# Patient Record
Sex: Male | Born: 1950 | Race: White | Hispanic: No | Marital: Married | Smoking: Former smoker
Health system: Southern US, Community
[De-identification: ages and names within clinical notes are randomized; demographics above are authoritative.]

## PROBLEM LIST (undated history)

## (undated) DIAGNOSIS — K219 Gastro-esophageal reflux disease without esophagitis: Secondary | ICD-10-CM

## (undated) DIAGNOSIS — N521 Erectile dysfunction due to diseases classified elsewhere: Secondary | ICD-10-CM

## (undated) DIAGNOSIS — I10 Essential (primary) hypertension: Secondary | ICD-10-CM

## (undated) DIAGNOSIS — I2510 Atherosclerotic heart disease of native coronary artery without angina pectoris: Secondary | ICD-10-CM

## (undated) DIAGNOSIS — E039 Hypothyroidism, unspecified: Secondary | ICD-10-CM

## (undated) DIAGNOSIS — N433 Hydrocele, unspecified: Secondary | ICD-10-CM

## (undated) DIAGNOSIS — I219 Acute myocardial infarction, unspecified: Secondary | ICD-10-CM

## (undated) HISTORY — PX: HEMORRHOIDECTOMY WITH HEMORRHOID BANDING: SHX5633

## (undated) HISTORY — DX: Hydrocele, unspecified: N43.3

## (undated) HISTORY — PX: CORONARY ANGIOPLASTY: SHX604

---

## 2006-06-29 ENCOUNTER — Ambulatory Visit: Payer: Self-pay | Admitting: Gastroenterology

## 2009-12-18 ENCOUNTER — Emergency Department: Payer: Self-pay | Admitting: Emergency Medicine

## 2009-12-18 ENCOUNTER — Other Ambulatory Visit: Payer: Self-pay

## 2010-01-25 ENCOUNTER — Encounter: Payer: Self-pay | Admitting: Cardiology

## 2010-02-14 ENCOUNTER — Encounter: Payer: Self-pay | Admitting: Cardiology

## 2010-03-17 ENCOUNTER — Encounter: Payer: Self-pay | Admitting: Cardiology

## 2010-08-15 ENCOUNTER — Emergency Department: Payer: Self-pay | Admitting: Emergency Medicine

## 2010-11-05 ENCOUNTER — Ambulatory Visit: Payer: Self-pay | Admitting: Cardiology

## 2011-10-28 ENCOUNTER — Ambulatory Visit: Payer: Self-pay | Admitting: Cardiology

## 2012-02-10 ENCOUNTER — Ambulatory Visit: Payer: Self-pay | Admitting: Unknown Physician Specialty

## 2012-11-05 ENCOUNTER — Ambulatory Visit: Payer: Self-pay | Admitting: Cardiology

## 2012-11-07 ENCOUNTER — Encounter: Payer: Self-pay | Admitting: Orthopedic Surgery

## 2012-11-12 ENCOUNTER — Ambulatory Visit: Payer: Self-pay | Admitting: Orthopedic Surgery

## 2012-11-14 ENCOUNTER — Encounter: Payer: Self-pay | Admitting: Orthopedic Surgery

## 2012-12-15 ENCOUNTER — Encounter: Payer: Self-pay | Admitting: Orthopedic Surgery

## 2013-06-19 ENCOUNTER — Encounter: Payer: Self-pay | Admitting: Orthopedic Surgery

## 2013-07-15 ENCOUNTER — Encounter: Payer: Self-pay | Admitting: Orthopedic Surgery

## 2013-10-28 ENCOUNTER — Ambulatory Visit: Payer: Self-pay | Admitting: Cardiology

## 2014-04-23 ENCOUNTER — Ambulatory Visit: Payer: Self-pay | Admitting: Internal Medicine

## 2015-03-18 DIAGNOSIS — I2510 Atherosclerotic heart disease of native coronary artery without angina pectoris: Secondary | ICD-10-CM | POA: Diagnosis not present

## 2015-03-18 DIAGNOSIS — N522 Drug-induced erectile dysfunction: Secondary | ICD-10-CM | POA: Diagnosis not present

## 2015-03-18 DIAGNOSIS — I10 Essential (primary) hypertension: Secondary | ICD-10-CM | POA: Diagnosis not present

## 2015-03-18 DIAGNOSIS — I214 Non-ST elevation (NSTEMI) myocardial infarction: Secondary | ICD-10-CM | POA: Diagnosis not present

## 2015-03-26 DIAGNOSIS — Z86010 Personal history of colonic polyps: Secondary | ICD-10-CM | POA: Diagnosis not present

## 2015-04-13 DIAGNOSIS — R739 Hyperglycemia, unspecified: Secondary | ICD-10-CM | POA: Diagnosis not present

## 2015-04-13 DIAGNOSIS — I2510 Atherosclerotic heart disease of native coronary artery without angina pectoris: Secondary | ICD-10-CM | POA: Diagnosis not present

## 2015-04-17 DIAGNOSIS — D229 Melanocytic nevi, unspecified: Secondary | ICD-10-CM | POA: Diagnosis not present

## 2015-04-17 DIAGNOSIS — D485 Neoplasm of uncertain behavior of skin: Secondary | ICD-10-CM | POA: Diagnosis not present

## 2015-04-17 DIAGNOSIS — D044 Carcinoma in situ of skin of scalp and neck: Secondary | ICD-10-CM | POA: Diagnosis not present

## 2015-04-17 DIAGNOSIS — D2222 Melanocytic nevi of left ear and external auricular canal: Secondary | ICD-10-CM | POA: Diagnosis not present

## 2015-04-17 DIAGNOSIS — Z1283 Encounter for screening for malignant neoplasm of skin: Secondary | ICD-10-CM | POA: Diagnosis not present

## 2015-04-17 DIAGNOSIS — L570 Actinic keratosis: Secondary | ICD-10-CM | POA: Diagnosis not present

## 2015-04-20 DIAGNOSIS — I2510 Atherosclerotic heart disease of native coronary artery without angina pectoris: Secondary | ICD-10-CM | POA: Diagnosis not present

## 2015-04-20 DIAGNOSIS — I10 Essential (primary) hypertension: Secondary | ICD-10-CM | POA: Diagnosis not present

## 2015-04-20 DIAGNOSIS — Z0000 Encounter for general adult medical examination without abnormal findings: Secondary | ICD-10-CM | POA: Diagnosis not present

## 2015-04-20 DIAGNOSIS — Z23 Encounter for immunization: Secondary | ICD-10-CM | POA: Diagnosis not present

## 2015-04-20 DIAGNOSIS — R739 Hyperglycemia, unspecified: Secondary | ICD-10-CM | POA: Diagnosis not present

## 2015-05-07 ENCOUNTER — Encounter: Payer: Self-pay | Admitting: *Deleted

## 2015-05-08 ENCOUNTER — Encounter: Payer: Self-pay | Admitting: *Deleted

## 2015-05-08 ENCOUNTER — Ambulatory Visit
Admission: RE | Admit: 2015-05-08 | Discharge: 2015-05-08 | Disposition: A | Discharge status: Home / Self Care | Payer: 59 | Source: Ambulatory Visit | Attending: Unknown Physician Specialty | Admitting: Unknown Physician Specialty

## 2015-05-08 ENCOUNTER — Ambulatory Visit: Payer: 59 | Admitting: Anesthesiology

## 2015-05-08 ENCOUNTER — Encounter
Admission: RE | Disposition: A | Discharge status: Home / Self Care | Payer: Self-pay | Source: Ambulatory Visit | Attending: Unknown Physician Specialty

## 2015-05-08 DIAGNOSIS — K5730 Diverticulosis of large intestine without perforation or abscess without bleeding: Secondary | ICD-10-CM | POA: Insufficient documentation

## 2015-05-08 DIAGNOSIS — Z86010 Personal history of colonic polyps: Secondary | ICD-10-CM | POA: Diagnosis not present

## 2015-05-08 DIAGNOSIS — Z7951 Long term (current) use of inhaled steroids: Secondary | ICD-10-CM | POA: Insufficient documentation

## 2015-05-08 DIAGNOSIS — K640 First degree hemorrhoids: Secondary | ICD-10-CM | POA: Diagnosis not present

## 2015-05-08 DIAGNOSIS — Z79899 Other long term (current) drug therapy: Secondary | ICD-10-CM | POA: Diagnosis not present

## 2015-05-08 DIAGNOSIS — K635 Polyp of colon: Secondary | ICD-10-CM | POA: Diagnosis not present

## 2015-05-08 DIAGNOSIS — I2510 Atherosclerotic heart disease of native coronary artery without angina pectoris: Secondary | ICD-10-CM | POA: Insufficient documentation

## 2015-05-08 DIAGNOSIS — Z7902 Long term (current) use of antithrombotics/antiplatelets: Secondary | ICD-10-CM | POA: Insufficient documentation

## 2015-05-08 DIAGNOSIS — Z1211 Encounter for screening for malignant neoplasm of colon: Secondary | ICD-10-CM | POA: Insufficient documentation

## 2015-05-08 DIAGNOSIS — D125 Benign neoplasm of sigmoid colon: Secondary | ICD-10-CM | POA: Insufficient documentation

## 2015-05-08 DIAGNOSIS — Z87891 Personal history of nicotine dependence: Secondary | ICD-10-CM | POA: Insufficient documentation

## 2015-05-08 DIAGNOSIS — K5790 Diverticulosis of intestine, part unspecified, without perforation or abscess without bleeding: Secondary | ICD-10-CM | POA: Diagnosis not present

## 2015-05-08 DIAGNOSIS — D123 Benign neoplasm of transverse colon: Secondary | ICD-10-CM | POA: Diagnosis not present

## 2015-05-08 DIAGNOSIS — E039 Hypothyroidism, unspecified: Secondary | ICD-10-CM | POA: Insufficient documentation

## 2015-05-08 DIAGNOSIS — I252 Old myocardial infarction: Secondary | ICD-10-CM | POA: Insufficient documentation

## 2015-05-08 DIAGNOSIS — N529 Male erectile dysfunction, unspecified: Secondary | ICD-10-CM | POA: Diagnosis not present

## 2015-05-08 DIAGNOSIS — I10 Essential (primary) hypertension: Secondary | ICD-10-CM | POA: Diagnosis not present

## 2015-05-08 DIAGNOSIS — K648 Other hemorrhoids: Secondary | ICD-10-CM | POA: Diagnosis not present

## 2015-05-08 DIAGNOSIS — Z7982 Long term (current) use of aspirin: Secondary | ICD-10-CM | POA: Diagnosis not present

## 2015-05-08 DIAGNOSIS — D122 Benign neoplasm of ascending colon: Secondary | ICD-10-CM | POA: Diagnosis not present

## 2015-05-08 HISTORY — DX: Acute myocardial infarction, unspecified: I21.9

## 2015-05-08 HISTORY — DX: Essential (primary) hypertension: I10

## 2015-05-08 HISTORY — PX: COLONOSCOPY WITH PROPOFOL: SHX5780

## 2015-05-08 HISTORY — DX: Hypothyroidism, unspecified: E03.9

## 2015-05-08 HISTORY — DX: Erectile dysfunction due to diseases classified elsewhere: N52.1

## 2015-05-08 HISTORY — DX: Atherosclerotic heart disease of native coronary artery without angina pectoris: I25.10

## 2015-05-08 SURGERY — COLONOSCOPY WITH PROPOFOL
Anesthesia: General

## 2015-05-08 MED ORDER — SODIUM CHLORIDE 0.9 % IV SOLN: INTRAVENOUS | Status: DC

## 2015-05-08 MED ORDER — SODIUM CHLORIDE 0.9 % IV SOLN
INTRAVENOUS | Status: DC
  Administered 2015-05-08: 1000 mL via INTRAVENOUS

## 2015-05-08 MED ORDER — MIDAZOLAM HCL 2 MG/2ML IJ SOLN
INTRAMUSCULAR | Status: DC | PRN
  Administered 2015-05-08: 1 mg via INTRAVENOUS

## 2015-05-08 MED ORDER — PROPOFOL 500 MG/50ML IV EMUL
INTRAVENOUS | Status: DC | PRN
  Administered 2015-05-08: 120 ug/kg/min via INTRAVENOUS

## 2015-05-08 MED ORDER — FENTANYL CITRATE (PF) 100 MCG/2ML IJ SOLN
INTRAMUSCULAR | Status: DC | PRN
  Administered 2015-05-08: 50 ug via INTRAVENOUS

## 2015-05-08 NOTE — Op Note (Signed)
[facility] Patient Name: Robert Ray Procedure Date: 05/08/2015 2:08 PM MRN: [id number] Account #: [id number] Date of Birth: 01/16/1951 Admit Type: Outpatient Age: 65 Room: [facility] ENDO ROOM 1 Gender: Male Note Status: Finalized Procedure:            Colonoscopy Indications:          High risk colon cancer surveillance: Personal history                        of colonic polyps Providers:            Manya Silvas, MD Referring MD:         Ocie Cornfield. Ouida Sills, MD (Referring MD) Medicines:            Propofol per Anesthesia Complications:        No immediate complications. Procedure:            Pre-Anesthesia Assessment:                       - After reviewing the risks and benefits, the patient                        was deemed in satisfactory condition to undergo the                        procedure.                       After obtaining informed consent, the colonoscope was                        passed under direct vision. Throughout the procedure,                        the patient's blood pressure, pulse, and oxygen                        saturations were monitored continuously. The                        Colonoscope was introduced through the anus and                        advanced to the the cecum, identified by appendiceal                        orifice and ileocecal valve. The colonoscopy was                        performed without difficulty. The patient tolerated the                        procedure well. The quality of the bowel preparation                        was good. Findings:      Five sessile polyps were found in the sigmoid colon, transverse colon       and ascending colon. The polyps were diminutive in size. These polyps       were removed with a jumbo cold forceps. Resection and retrieval were  complete.      Multiple small-mouthed diverticula were found in the sigmoid colon.      Internal hemorrhoids were found  during endoscopy. The hemorrhoids were       small and Grade I (internal hemorrhoids that do not prolapse).      The exam was otherwise without abnormality. Impression:           - Five diminutive polyps in the sigmoid colon, in the                        transverse colon and in the ascending colon, removed                        with a jumbo cold forceps. Resected and retrieved.                       - Diverticulosis in the sigmoid colon.                       - Internal hemorrhoids.                       - The examination was otherwise normal. Recommendation:       - Await pathology results. Manya Silvas, MD 05/08/2015 2:41:20 PM This report has been signed electronically. Number of Addenda: 0 Note Initiated On: 05/08/2015 2:08 PM Scope Withdrawal Time: 0 hours 15 minutes 39 seconds  Total Procedure Duration: 0 hours 25 minutes 26 seconds       [facility]

## 2015-05-08 NOTE — H&P (Signed)
Primary Care Physician:  Kirk Ruths., MD Primary Gastroenterologist:  Dr. Vira Agar  Pre-Procedure History & Physical: HPI:  Robert Ray is a 65 y.o. male is here for an colonoscopy.   Past Medical History  Diagnosis Date  . Coronary artery disease   . Hypertension   . Hypothyroidism   . Myocardial infarction ([place])   . 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 Coronary angioplasty    . Hemorrhoidectomy with hemorrhoid banding      Prior to Admission medications   Medication Sig Start Date End Date Taking? Authorizing Provider  albuterol (PROVENTIL HFA;VENTOLIN HFA) 108 (90 Base) MCG/ACT inhaler Inhale 2 puffs into the lungs every 6 (six) hours as needed for wheezing or shortness of breath.   Yes Historical Provider, MD  aspirin (ASPIRIN EC) 81 MG EC tablet Take 81 mg by mouth daily. Swallow whole.   Yes Historical Provider, MD  atorvastatin (LIPITOR) 40 MG tablet Take 40 mg by mouth daily.   Yes Historical Provider, MD  clopidogrel (PLAVIX) 75 MG tablet Take 75 mg by mouth daily.   Yes Historical Provider, MD  fluticasone (FLOVENT HFA) 110 MCG/ACT inhaler Inhale into the lungs 2 (two) times daily.   Yes Historical Provider, MD  levothyroxine (SYNTHROID, LEVOTHROID) 150 MCG tablet Take 150 mcg by mouth daily before breakfast.   Yes Historical Provider, MD  levothyroxine (SYNTHROID, LEVOTHROID) 25 MCG tablet Take 25 mcg by mouth daily before breakfast.   Yes Historical Provider, MD  lisinopril (PRINIVIL,ZESTRIL) 5 MG tablet Take 5 mg by mouth daily.   Yes Historical Provider, MD  metoprolol succinate (TOPROL-XL) 100 MG 24 hr tablet Take 100 mg by mouth daily. Take with or immediately following a meal.   Yes Historical Provider, MD  nitroGLYCERIN (NITROSTAT) 0.4 MG SL tablet Place 0.4 mg under the tongue every 5 (five) minutes as needed for chest pain (Take up to 3 doses).   Yes Historical Provider, MD   tadalafil (CIALIS) 20 MG tablet Take 20 mg by mouth daily as needed for erectile dysfunction.   Yes Historical Provider, MD    Allergies as of 04/24/2015  . (Not on File)    History reviewed. No pertinent family history.  Social History   Social History  . Marital Status: Married    Spouse Name: N/A  . Number of Children: N/A  . Years of Education: N/A   Occupational History  . Not on file.   Social History Main Topics  . Smoking status: Former Research scientist (life sciences)  . Smokeless tobacco: Never Used  . Alcohol Use: Yes  . Drug Use: No  . Sexual Activity: Not on file   Other Topics Concern  . Not on file   Social History Narrative    Review of Systems: See HPI, otherwise negative ROS  Physical Exam: BP 116/77 mmHg  Pulse 59  Temp(Src) 96.9 F (36.1 C) (Tympanic)  Resp 20  Ht 6\' 3"  (1.905 m)  Wt 111.131 kg (245 lb)  BMI 30.62 kg/m2  SpO2 100% General:   Alert,  pleasant and cooperative in NAD Head:  Normocephalic and atraumatic. Neck:  Supple; no masses or thyromegaly. Lungs:  Clear throughout to auscultation.    Heart:  Regular rate and rhythm. Abdomen:  Soft, nontender and nondistended. Normal bowel sounds, without guarding, and without rebound.   Neurologic:  Alert and  oriented x4;  grossly normal neurologically.  Impression/Plan: Robert Rumpf  Ray is here for an colonoscopy to be performed for [facility] colon polyps  Risks, benefits, limitations, and alternatives regarding  colonoscopy have been reviewed with the patient.  Questions have been answered.  All parties agreeable.   Gaylyn Cheers, MD  05/08/2015, 2:06 PM

## 2015-05-08 NOTE — Anesthesia Procedure Notes (Signed)
Performed by: COOK-MARTIN, Brytani Voth Pre-anesthesia Checklist: Patient identified, Emergency Drugs available, Suction available, Patient being monitored and Timeout performed Patient Re-evaluated:Patient Re-evaluated prior to inductionOxygen Delivery Method: Nasal cannula Preoxygenation: Pre-oxygenation with 100% oxygen Intubation Type: IV induction Placement Confirmation: positive ETCO2 and CO2 detector       

## 2015-05-08 NOTE — Anesthesia Postprocedure Evaluation (Signed)
Anesthesia Post Note  Patient: Robert Ray  Procedure(s) Performed: Procedure(s) (LRB): COLONOSCOPY WITH PROPOFOL (N/A)  Patient location during evaluation: Endoscopy Anesthesia Type: General Level of consciousness: awake and alert Pain management: pain level controlled Vital Signs Assessment: post-procedure vital signs reviewed and stable Respiratory status: spontaneous breathing and respiratory function stable Cardiovascular status: stable Anesthetic complications: no    Last Vitals:  Filed Vitals:   05/08/15 1500 05/08/15 1510  BP: 128/82 116/89  Pulse: 50 54  Temp:    Resp: 18 14    Last Pain: There were no vitals filed for this visit.               Marnie Fazzino K

## 2015-05-08 NOTE — Anesthesia Preprocedure Evaluation (Signed)
Anesthesia Evaluation  Patient identified by MRN, date of birth, ID band Patient awake    Reviewed: Allergy & Precautions, NPO status , Patient's Chart, lab work & pertinent test results  History of Anesthesia Complications Negative for: history of anesthetic complications  Airway Mallampati: II       Dental   Pulmonary former smoker,           Cardiovascular hypertension, Pt. on medications and Pt. on home beta blockers + CAD, + Past MI and + Cardiac Stents       Neuro/Psych negative neurological ROS     GI/Hepatic negative GI ROS, Neg liver ROS,   Endo/Other  negative endocrine ROSHypothyroidism   Renal/GU negative Renal ROS     Musculoskeletal   Abdominal   Peds  Hematology negative hematology ROS (+)   Anesthesia Other Findings   Reproductive/Obstetrics                             Anesthesia Physical Anesthesia Plan  ASA: III  Anesthesia Plan: General   Post-op Pain Management:    Induction: Intravenous  Airway Management Planned: Nasal Cannula  Additional Equipment:   Intra-op Plan:   Post-operative Plan:   Informed Consent: I have reviewed the patients History and Physical, chart, labs and discussed the procedure including the risks, benefits and alternatives for the proposed anesthesia with the patient or authorized representative who has indicated his/her understanding and acceptance.     Plan Discussed with:   Anesthesia Plan Comments:         Anesthesia Quick Evaluation

## 2015-05-08 NOTE — Transfer of Care (Signed)
Immediate Anesthesia Transfer of Care Note  Patient: Robert Ray  Procedure(s) Performed: Procedure(s): COLONOSCOPY WITH PROPOFOL (N/A)  Patient Location: PACU  Anesthesia Type:General  Level of Consciousness: awake and sedated  Airway & Oxygen Therapy: Patient Spontanous Breathing and Patient connected to nasal cannula oxygen  Post-op Assessment: Report given to RN and Post -op Vital signs reviewed and stable  Post vital signs: Reviewed and stable  Last Vitals:  Filed Vitals:   05/08/15 1329  BP: 116/77  Pulse: 59  Temp: 36.1 C  Resp: 20    Complications: No apparent anesthesia complications and Patient re-intubated

## 2015-05-09 ENCOUNTER — Encounter: Payer: Self-pay | Admitting: Unknown Physician Specialty

## 2015-05-12 LAB — SURGICAL PATHOLOGY

## 2015-08-11 DIAGNOSIS — C4442 Squamous cell carcinoma of skin of scalp and neck: Secondary | ICD-10-CM | POA: Diagnosis not present

## 2015-09-22 DIAGNOSIS — J209 Acute bronchitis, unspecified: Secondary | ICD-10-CM | POA: Diagnosis not present

## 2015-09-22 DIAGNOSIS — I10 Essential (primary) hypertension: Secondary | ICD-10-CM | POA: Diagnosis not present

## 2015-09-22 DIAGNOSIS — I2510 Atherosclerotic heart disease of native coronary artery without angina pectoris: Secondary | ICD-10-CM | POA: Diagnosis not present

## 2015-09-22 DIAGNOSIS — N529 Male erectile dysfunction, unspecified: Secondary | ICD-10-CM | POA: Diagnosis not present

## 2015-09-25 DIAGNOSIS — H524 Presbyopia: Secondary | ICD-10-CM | POA: Diagnosis not present

## 2015-09-28 ENCOUNTER — Other Ambulatory Visit: Payer: Self-pay | Admitting: Cardiology

## 2015-09-28 DIAGNOSIS — I2510 Atherosclerotic heart disease of native coronary artery without angina pectoris: Secondary | ICD-10-CM

## 2015-10-05 DIAGNOSIS — I208 Other forms of angina pectoris: Secondary | ICD-10-CM | POA: Diagnosis not present

## 2015-10-06 ENCOUNTER — Ambulatory Visit
Admission: RE | Admit: 2015-10-06 | Discharge: 2015-10-06 | Disposition: A | Discharge status: Home / Self Care | Payer: 59 | Source: Ambulatory Visit | Attending: Cardiology | Admitting: Cardiology

## 2015-10-06 DIAGNOSIS — I2510 Atherosclerotic heart disease of native coronary artery without angina pectoris: Secondary | ICD-10-CM | POA: Diagnosis not present

## 2015-10-06 MED ORDER — TECHNETIUM TC 99M TETROFOSMIN IV KIT
31.5290 | PACK | Freq: Once | INTRAVENOUS | Status: AC | PRN
  Administered 2015-10-06: 31.529 via INTRAVENOUS

## 2015-10-06 MED ORDER — TECHNETIUM TC 99M TETROFOSMIN IV KIT
12.5700 | PACK | Freq: Once | INTRAVENOUS | Status: AC | PRN
  Administered 2015-10-06: 12.57 via INTRAVENOUS

## 2015-10-07 LAB — NM MYOCAR MULTI W/SPECT W/WALL MOTION / EF
CHL CUP NUCLEAR SDS: 1
CHL CUP NUCLEAR SRS: 15
CHL CUP NUCLEAR SSS: 9
CHL CUP STRESS STAGE 1 SPEED: 0.1 mph
CHL CUP STRESS STAGE 2 DBP: 68 mmHg
CHL CUP STRESS STAGE 2 GRADE: 10 %
CHL CUP STRESS STAGE 2 SPEED: 1.7 mph
CHL CUP STRESS STAGE 3 GRADE: 12 %
CHL CUP STRESS STAGE 3 HR: 102 {beats}/min
CHL CUP STRESS STAGE 3 SPEED: 2.5 mph
CHL CUP STRESS STAGE 5 SPEED: 4.1 mph
CHL CUP STRESS STAGE 6 GRADE: 0 %
CHL CUP STRESS STAGE 6 HR: 117 {beats}/min
CHL CUP STRESS STAGE 6 SPEED: 0 mph
CSEPPHR: 127 {beats}/min
CSEPPMHR: 83 %
Estimated workload: 10.5 METS
LV dias vol: 82 mL (ref 62–150)
LVSYSVOL: 28 mL
NUC STRESS TID: 0.78
Stage 1 Grade: 0 %
Stage 1 HR: 57 {beats}/min
Stage 2 HR: 86 {beats}/min
Stage 2 SBP: 157 mmHg
Stage 4 Grade: 14 %
Stage 4 HR: 121 {beats}/min
Stage 4 Speed: 3.4 mph
Stage 5 Grade: 16 %
Stage 5 HR: 127 {beats}/min
Stage 7 DBP: 75 mmHg
Stage 7 Grade: 0 %
Stage 7 HR: 74 {beats}/min
Stage 7 SBP: 145 mmHg
Stage 7 Speed: 0 mph

## 2015-10-15 DIAGNOSIS — R739 Hyperglycemia, unspecified: Secondary | ICD-10-CM | POA: Diagnosis not present

## 2015-10-15 DIAGNOSIS — N529 Male erectile dysfunction, unspecified: Secondary | ICD-10-CM | POA: Diagnosis not present

## 2015-10-15 DIAGNOSIS — I2510 Atherosclerotic heart disease of native coronary artery without angina pectoris: Secondary | ICD-10-CM | POA: Diagnosis not present

## 2015-10-27 DIAGNOSIS — I2510 Atherosclerotic heart disease of native coronary artery without angina pectoris: Secondary | ICD-10-CM | POA: Diagnosis not present

## 2015-10-27 DIAGNOSIS — I10 Essential (primary) hypertension: Secondary | ICD-10-CM | POA: Diagnosis not present

## 2015-10-27 DIAGNOSIS — E039 Hypothyroidism, unspecified: Secondary | ICD-10-CM | POA: Diagnosis not present

## 2015-10-27 DIAGNOSIS — R739 Hyperglycemia, unspecified: Secondary | ICD-10-CM | POA: Diagnosis not present

## 2015-11-10 DIAGNOSIS — A047 Enterocolitis due to Clostridium difficile: Secondary | ICD-10-CM | POA: Diagnosis not present

## 2016-04-06 DIAGNOSIS — R739 Hyperglycemia, unspecified: Secondary | ICD-10-CM | POA: Diagnosis not present

## 2016-04-06 DIAGNOSIS — I2510 Atherosclerotic heart disease of native coronary artery without angina pectoris: Secondary | ICD-10-CM | POA: Diagnosis not present

## 2016-04-06 DIAGNOSIS — I10 Essential (primary) hypertension: Secondary | ICD-10-CM | POA: Diagnosis not present

## 2017-04-07 ENCOUNTER — Encounter
Admission: RE | Disposition: A | Discharge status: Home / Self Care | Payer: Self-pay | Source: Ambulatory Visit | Attending: Unknown Physician Specialty

## 2017-04-07 ENCOUNTER — Ambulatory Visit: Payer: Medicare Other | Admitting: Anesthesiology

## 2017-04-07 ENCOUNTER — Other Ambulatory Visit: Payer: Self-pay

## 2017-04-07 ENCOUNTER — Encounter: Payer: Self-pay | Admitting: *Deleted

## 2017-04-07 ENCOUNTER — Ambulatory Visit
Admission: RE | Admit: 2017-04-07 | Discharge: 2017-04-07 | Disposition: A | Discharge status: Home / Self Care | Payer: Medicare Other | Source: Ambulatory Visit | Attending: Unknown Physician Specialty | Admitting: Unknown Physician Specialty

## 2017-04-07 DIAGNOSIS — K449 Diaphragmatic hernia without obstruction or gangrene: Secondary | ICD-10-CM | POA: Diagnosis not present

## 2017-04-07 DIAGNOSIS — Z79890 Hormone replacement therapy (postmenopausal): Secondary | ICD-10-CM | POA: Diagnosis not present

## 2017-04-07 DIAGNOSIS — I252 Old myocardial infarction: Secondary | ICD-10-CM | POA: Diagnosis not present

## 2017-04-07 DIAGNOSIS — Z79899 Other long term (current) drug therapy: Secondary | ICD-10-CM | POA: Diagnosis not present

## 2017-04-07 DIAGNOSIS — I2510 Atherosclerotic heart disease of native coronary artery without angina pectoris: Secondary | ICD-10-CM | POA: Insufficient documentation

## 2017-04-07 DIAGNOSIS — Z87891 Personal history of nicotine dependence: Secondary | ICD-10-CM | POA: Diagnosis not present

## 2017-04-07 DIAGNOSIS — Z7902 Long term (current) use of antithrombotics/antiplatelets: Secondary | ICD-10-CM | POA: Insufficient documentation

## 2017-04-07 DIAGNOSIS — E039 Hypothyroidism, unspecified: Secondary | ICD-10-CM | POA: Diagnosis not present

## 2017-04-07 DIAGNOSIS — K222 Esophageal obstruction: Secondary | ICD-10-CM | POA: Insufficient documentation

## 2017-04-07 DIAGNOSIS — Z7982 Long term (current) use of aspirin: Secondary | ICD-10-CM | POA: Insufficient documentation

## 2017-04-07 DIAGNOSIS — K219 Gastro-esophageal reflux disease without esophagitis: Secondary | ICD-10-CM | POA: Insufficient documentation

## 2017-04-07 DIAGNOSIS — I10 Essential (primary) hypertension: Secondary | ICD-10-CM | POA: Insufficient documentation

## 2017-04-07 DIAGNOSIS — Z7951 Long term (current) use of inhaled steroids: Secondary | ICD-10-CM | POA: Insufficient documentation

## 2017-04-07 HISTORY — PX: ESOPHAGOGASTRODUODENOSCOPY (EGD) WITH PROPOFOL: SHX5813

## 2017-04-07 SURGERY — ESOPHAGOGASTRODUODENOSCOPY (EGD) WITH PROPOFOL
Anesthesia: General

## 2017-04-07 MED ORDER — PROPOFOL 500 MG/50ML IV EMUL
INTRAVENOUS | Status: DC | PRN
  Administered 2017-04-07: 125 ug/kg/min via INTRAVENOUS

## 2017-04-07 MED ORDER — SODIUM CHLORIDE 0.9 % IV SOLN: INTRAVENOUS | Status: DC

## 2017-04-07 MED ORDER — SODIUM CHLORIDE 0.9 % IV SOLN
INTRAVENOUS | Status: DC
  Administered 2017-04-07: 09:00:00 via INTRAVENOUS

## 2017-04-07 MED ORDER — LACTATED RINGERS IV SOLN
INTRAVENOUS | Status: DC | PRN
  Administered 2017-04-07: 09:00:00 via INTRAVENOUS

## 2017-04-07 MED ORDER — PROPOFOL 10 MG/ML IV BOLUS
INTRAVENOUS | Status: DC | PRN
  Administered 2017-04-07 (×2): 50 mg via INTRAVENOUS

## 2017-04-07 NOTE — Anesthesia Postprocedure Evaluation (Signed)
Anesthesia Post Note  Patient: Robert Ray  Procedure(s) Performed: ESOPHAGOGASTRODUODENOSCOPY (EGD) WITH PROPOFOL (N/A )  Patient location during evaluation: Endoscopy Anesthesia Type: General Level of consciousness: awake and alert Pain management: pain level controlled Vital Signs Assessment: post-procedure vital signs reviewed and stable Respiratory status: spontaneous breathing, nonlabored ventilation, respiratory function stable and patient connected to nasal cannula oxygen Cardiovascular status: blood pressure returned to baseline and stable Postop Assessment: no apparent nausea or vomiting Anesthetic complications: no     Last Vitals:  Vitals:   04/07/17 0832 04/07/17 0943  BP: 126/74 117/70  Pulse: (!) 52 (!) 57  Resp: 16 20  Temp: (!) 35.7 C (!) 35.7 C  SpO2: 96% 94%    Last Pain:  Vitals:   04/07/17 0943  TempSrc: Tympanic                 Avyukth Bontempo S

## 2017-04-07 NOTE — Op Note (Signed)
[facility] Patient Name: Robert Ray Procedure Date: 04/07/2017 9:12 AM MRN: [id number] Account #: [id number] Date of Birth: January 30, 1951 Admit Type: Outpatient Age: 67 Room: [facility] ENDO ROOM 1 Gender: Male Note Status: Finalized Procedure:            Upper GI endoscopy Indications:          Heartburn, Suspected gastro-esophageal reflux disease Providers:            Manya Silvas, MD Referring MD:         Ocie Cornfield. Ouida Sills MD, MD (Referring MD) Medicines:            Propofol per Anesthesia Complications:        No immediate complications. Procedure:            Pre-Anesthesia Assessment:                       - After reviewing the risks and benefits, the patient                        was deemed in satisfactory condition to undergo the                        procedure.                       After obtaining informed consent, the endoscope was                        passed under direct vision. Throughout the procedure,                        the patient's blood pressure, pulse, and oxygen                        saturations were monitored continuously. The Endoscope                        was introduced through the mouth, and advanced to the                        second part of duodenum. The upper GI endoscopy was                        accomplished without difficulty. The patient tolerated                        the procedure well. Findings:      Mucosal changes including ringed esophagus, feline appearance and       longitudinal furrows were found in the middle third of the esophagus and       in the lower third of the esophagus. Biopsies were obtained from the       proximal and distal esophagus with cold forceps for histology of       suspected eosinophilic esophagitis. Biopsies were obtained from the mid       proximal and distal esophagus with cold forceps for histology of       suspected eosinophilic esophagitis. Biopsies were taken with  a cold       forceps for histology.      A small hiatal hernia was present.  A mild Schatzki ring (acquired) was found at the gastroesophageal       junction. A guidewire was placed and the scope was withdrawn. Dilation       was performed with a Savary dilator with mild resistance at 15 mm.      The examined duodenum was normal.      The stomach was otherwise normal. 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 Biopsied.                       - Small hiatal hernia.                       - Mild Schatzki ring. Dilated. Recommendation:       - Await pathology results.                       - soft food for 3 days, eat slowly, chew well, take                        small bites Manya Silvas, MD 04/07/2017 9:42:58 AM This report has been signed electronically. Number of Addenda: 0 Note Initiated On: 04/07/2017 9:12 AM      [facility]

## 2017-04-07 NOTE — Transfer of Care (Signed)
Immediate Anesthesia Transfer of Care Note  Patient: Robert Ray  Procedure(s) Performed: ESOPHAGOGASTRODUODENOSCOPY (EGD) WITH PROPOFOL (N/A )  Patient Location: PACU and Endoscopy Unit  Anesthesia Type:General  Level of Consciousness: awake, alert  and oriented  Airway & Oxygen Therapy: Patient Spontanous Breathing  Post-op Assessment: Report given to RN and Post -op Vital signs reviewed and stable  Post vital signs: Reviewed and stable  Last Vitals:  Vitals:   04/07/17 0832  BP: 126/74  Pulse: (!) 52  Resp: 16  Temp: (!) 35.7 C  SpO2: 96%    Last Pain:  Vitals:   04/07/17 0832  TempSrc: Tympanic      Patients Stated Pain Goal: 6 (93/57/01 7793)  Complications: No apparent anesthesia complications

## 2017-04-07 NOTE — H&P (Signed)
Primary Care Physician:  Kirk Ruths, MD Primary Gastroenterologist:  Dr. Vira Agar  Pre-Procedure History & Physical: HPI:  Robert Ray is a 67 y.o. male is here for an endoscopy.  This is being done for GERD.   Past Medical History:  Diagnosis Date  . Coronary artery disease   . Erectile disorder due to medical condition in male patient   . Hypertension   . Hypothyroidism   . Myocardial infarction [facility])     Past Surgical History:  Procedure Laterality Date  . COLONOSCOPY WITH PROPOFOL N/A 05/08/2015   Procedure: COLONOSCOPY WITH PROPOFOL;  Surgeon: Manya Silvas, MD;  Location: [facility] ENDOSCOPY;  Service: Endoscopy;  Laterality: N/A;  . CORONARY ANGIOPLASTY    . HEMORRHOIDECTOMY WITH HEMORRHOID BANDING      Prior to Admission medications   Medication Sig Start Date End Date Taking? Authorizing Provider  albuterol (PROVENTIL HFA;VENTOLIN HFA) 108 (90 Base) MCG/ACT inhaler Inhale 2 puffs into the lungs every 6 (six) hours as needed for wheezing or shortness of breath.   Yes [provider]  aspirin (ASPIRIN EC) 81 MG EC tablet Take 81 mg by mouth daily. Swallow whole.   Yes [provider]  atorvastatin (LIPITOR) 40 MG tablet Take 40 mg by mouth daily.   Yes [provider]  clopidogrel (PLAVIX) 75 MG tablet Take 75 mg by mouth daily.   Yes [provider]  fluticasone (FLOVENT HFA) 110 MCG/ACT inhaler Inhale into the lungs 2 (two) times daily.   Yes [provider]  levothyroxine (SYNTHROID, LEVOTHROID) 150 MCG tablet Take 175 mcg by mouth daily before breakfast.    Yes [provider]  lisinopril (PRINIVIL,ZESTRIL) 5 MG tablet Take 5 mg by mouth daily.   Yes [provider]  metoprolol succinate (TOPROL-XL) 100 MG 24 hr tablet Take 100 mg by mouth daily. Take with or immediately following a meal.   Yes [provider]  nitroGLYCERIN (NITROSTAT) 0.4 MG SL tablet Place 0.4 mg under the tongue every  5 (five) minutes as needed for chest pain (Take up to 3 doses).   Yes [provider]  tadalafil (CIALIS) 20 MG tablet Take 20 mg by mouth daily as needed for erectile dysfunction.   Yes [provider]  levothyroxine (SYNTHROID, LEVOTHROID) 25 MCG tablet Take 25 mcg by mouth daily before breakfast.    [provider]    Allergies as of 02/27/2017  . (No Known Allergies)    History reviewed. No pertinent family history.  Social History   Socioeconomic History  . Marital status: Married    Spouse name: Not on file  . Number of children: Not on file  . Years of education: Not on file  . Highest education level: Not on file  Social Needs  . Financial resource strain: Not on file  . Food insecurity - worry: Not on file  . Food insecurity - inability: Not on file  . Transportation needs - medical: Not on file  . Transportation needs - non-medical: Not on file  Occupational History  . Not on file  Tobacco Use  . Smoking status: Former Research scientist (life sciences)  . Smokeless tobacco: Never Used  Substance and Sexual Activity  . Alcohol use: Yes    Comment: occ.  . Drug use: No  . Sexual activity: Yes  Other Topics Concern  . Not on file  Social History Narrative  . Not on file    Review of Systems: See HPI, otherwise negative ROS  Physical Exam: BP 126/74   Pulse (!) 52   Temp (!) 96.3 F (35.7 C) (Tympanic)   Resp 16   Ht 6\' 3"  (1.905 m)   Wt 113.4 kg (250 lb)   SpO2 96%   BMI 31.25 kg/m  General:   Alert,  pleasant and cooperative in NAD Head:  Normocephalic and atraumatic. Neck:  Supple; no masses or thyromegaly. Lungs:  Clear throughout to auscultation.    Heart:  Regular rate and rhythm. Abdomen:  Soft, nontender and nondistended. Normal bowel sounds, without guarding, and without rebound.   Neurologic:  Alert and  oriented x4;  grossly normal neurologically.  Impression/Plan: [name] is here for an endoscopy to be performed for  GERD  Risks, benefits, limitations, and alternatives regarding  endoscopy have been reviewed with the patient.  Questions have been answered.  All parties agreeable.   Gaylyn Cheers, MD  04/07/2017, 9:18 AM

## 2017-04-07 NOTE — Anesthesia Post-op Follow-up Note (Signed)
Anesthesia QCDR form completed.        

## 2017-04-07 NOTE — Anesthesia Preprocedure Evaluation (Signed)
Anesthesia Evaluation  Patient identified by MRN, date of birth, ID band Patient awake    Reviewed: Allergy & Precautions, NPO status , Patient's Chart, lab work & pertinent test results, reviewed documented beta blocker date and time   Airway Mallampati: III  TM Distance: >3 FB     Dental  (+) Chipped   Pulmonary           Cardiovascular hypertension, Pt. on medications and Pt. on home beta blockers + CAD and + Past MI       Neuro/Psych    GI/Hepatic   Endo/Other  Hypothyroidism   Renal/GU      Musculoskeletal   Abdominal   Peds  Hematology   Anesthesia Other Findings   Reproductive/Obstetrics                             Anesthesia Physical Anesthesia Plan  ASA: III  Anesthesia Plan: General   Post-op Pain Management:    Induction: Intravenous  PONV Risk Score and Plan:   Airway Management Planned:   Additional Equipment:   Intra-op Plan:   Post-operative Plan:   Informed Consent: I have reviewed the patients History and Physical, chart, labs and discussed the procedure including the risks, benefits and alternatives for the proposed anesthesia with the patient or authorized representative who has indicated his/her understanding and acceptance.     Plan Discussed with: CRNA  Anesthesia Plan Comments:         Anesthesia Quick Evaluation

## 2017-04-10 ENCOUNTER — Encounter: Payer: Self-pay | Admitting: Unknown Physician Specialty

## 2017-04-10 LAB — SURGICAL PATHOLOGY

## 2018-06-30 ENCOUNTER — Other Ambulatory Visit
Admission: RE | Admit: 2018-06-30 | Discharge: 2018-06-30 | Disposition: A | Discharge status: Home / Self Care | Payer: Medicare Other | Source: Ambulatory Visit | Attending: Student | Admitting: Student

## 2018-06-30 DIAGNOSIS — R197 Diarrhea, unspecified: Secondary | ICD-10-CM | POA: Diagnosis present

## 2018-06-30 LAB — C DIFFICILE QUICK SCREEN W PCR REFLEX
C Diff antigen: NEGATIVE
C Diff interpretation: NOT DETECTED
C Diff toxin: NEGATIVE

## 2018-07-02 LAB — GASTROINTESTINAL PANEL BY PCR, STOOL (REPLACES STOOL CULTURE)

## 2018-07-04 LAB — PANCREATIC ELASTASE, FECAL: Pancreatic Elastase-1, Stool: 327 ug Elast./g (ref >200)

## 2018-07-05 LAB — CALPROTECTIN, FECAL: Calprotectin, Fecal: 119 ug/g (ref 0–120)

## 2018-08-10 ENCOUNTER — Other Ambulatory Visit
Admission: RE | Admit: 2018-08-10 | Discharge: 2018-08-10 | Disposition: A | Discharge status: Home / Self Care | Payer: Medicare Other | Source: Ambulatory Visit | Attending: Student | Admitting: Student

## 2018-08-10 DIAGNOSIS — K529 Noninfective gastroenteritis and colitis, unspecified: Secondary | ICD-10-CM | POA: Insufficient documentation

## 2018-08-10 DIAGNOSIS — A044 Other intestinal Escherichia coli infections: Secondary | ICD-10-CM | POA: Insufficient documentation

## 2018-08-10 LAB — GASTROINTESTINAL PANEL BY PCR, STOOL (REPLACES STOOL CULTURE)

## 2018-08-15 LAB — CALPROTECTIN, FECAL: Calprotectin, Fecal: 107 ug/g (ref 0–120)

## 2018-10-04 ENCOUNTER — Other Ambulatory Visit: Payer: Self-pay

## 2018-10-04 ENCOUNTER — Other Ambulatory Visit
Admission: RE | Admit: 2018-10-04 | Discharge: 2018-10-04 | Disposition: A | Discharge status: Home / Self Care | Payer: Medicare Other | Source: Ambulatory Visit | Attending: Internal Medicine | Admitting: Internal Medicine

## 2018-10-04 DIAGNOSIS — Z01812 Encounter for preprocedural laboratory examination: Secondary | ICD-10-CM | POA: Diagnosis present

## 2018-10-04 DIAGNOSIS — Z20828 Contact with and (suspected) exposure to other viral communicable diseases: Secondary | ICD-10-CM | POA: Insufficient documentation

## 2018-10-04 LAB — SARS CORONAVIRUS 2 (TAT 6-24 HRS): SARS Coronavirus 2: NEGATIVE

## 2018-10-05 ENCOUNTER — Other Ambulatory Visit: Admission: RE | Admit: 2018-10-05 | Payer: Medicare Other | Source: Ambulatory Visit

## 2018-10-09 ENCOUNTER — Encounter: Payer: Self-pay | Admitting: *Deleted

## 2018-10-09 NOTE — H&P (Signed)
Outpatient short stay form Pre-procedure 10/09/2018 2:07 PM Robert Ray Robert Ray, [name]  Primary Physician: Robert Ray, [name]  Reason for visit:  Chronic diarrhea, elevated TTG level (6)  History of present illness:  68 y/o patient presents with chronic diarrhea and borderline elevated serum TTG (6) and negative Anti-EMA Ab. IgA levels were normal. No rectal bleeding.    No current facility-administered medications for this encounter.   Current Outpatient Medications:  .  albuterol (PROVENTIL HFA;VENTOLIN HFA) 108 (90 Base) MCG/ACT inhaler, Inhale 2 puffs into the lungs every 6 (six) hours as needed for wheezing or shortness of breath., Disp: , Rfl:  .  aspirin (ASPIRIN EC) 81 MG EC tablet, Take 81 mg by mouth daily. Swallow whole., Disp: , Rfl:  .  atorvastatin (LIPITOR) 40 MG tablet, Take 40 mg by mouth daily., Disp: , Rfl:  .  clopidogrel (PLAVIX) 75 MG tablet, Take 75 mg by mouth daily., Disp: , Rfl:  .  fluticasone (FLOVENT HFA) 110 MCG/ACT inhaler, Inhale into the lungs 2 (two) times daily., Disp: , Rfl:  .  levothyroxine (SYNTHROID, LEVOTHROID) 150 MCG tablet, Take 175 mcg by mouth daily before breakfast. , Disp: , Rfl:  .  levothyroxine (SYNTHROID, LEVOTHROID) 25 MCG tablet, Take 25 mcg by mouth daily before breakfast., Disp: , Rfl:  .  lisinopril (PRINIVIL,ZESTRIL) 5 MG tablet, Take 5 mg by mouth daily., Disp: , Rfl:  .  metoprolol succinate (TOPROL-XL) 100 MG 24 hr tablet, Take 100 mg by mouth daily. Take with or immediately following a meal., Disp: , Rfl:  .  nitroGLYCERIN (NITROSTAT) 0.4 MG SL tablet, Place 0.4 mg under the tongue every 5 (five) minutes as needed for chest pain (Take up to 3 doses)., Disp: , Rfl:  .  tadalafil (CIALIS) 20 MG tablet, Take 20 mg by mouth daily as needed for erectile dysfunction., Disp: , Rfl:   No medications prior to admission.     No Known Allergies   Past Medical History:  Diagnosis Date  . Coronary artery disease   . Erectile  disorder due to medical condition in male patient   . GERD (gastroesophageal reflux disease)   . Hypertension   . Hypothyroidism   . Myocardial infarction [facility])     Review of systems:  Otherwise negative.    Physical Exam  Gen: Alert, oriented. Appears stated age.  HEENT: /AT. PERRLA. Lungs: CTA, no wheezes. CV: RR nl S1, S2. Abd: soft, benign, no masses. BS+ Ext: No edema. Pulses 2+    Planned procedures: Proceed with EGD and colonoscopy. The patient understands the nature of the planned procedure, indications, risks, alternatives and potential complications including but not limited to bleeding, infection, perforation, damage to internal organs and possible oversedation/side effects from anesthesia. The patient agrees and gives consent to proceed.  Please refer to procedure notes for findings, recommendations and patient disposition/instructions.     [name] Robert Ray, [name] Gastroenterology 10/09/2018  2:07 PM

## 2018-10-10 ENCOUNTER — Encounter
Admission: RE | Disposition: A | Discharge status: Home / Self Care | Payer: Self-pay | Source: Home / Self Care | Attending: Internal Medicine

## 2018-10-10 ENCOUNTER — Encounter: Payer: Self-pay | Admitting: *Deleted

## 2018-10-10 ENCOUNTER — Ambulatory Visit: Payer: Medicare Other | Admitting: Anesthesiology

## 2018-10-10 ENCOUNTER — Ambulatory Visit
Admission: RE | Admit: 2018-10-10 | Discharge: 2018-10-10 | Disposition: A | Discharge status: Home / Self Care | Payer: Medicare Other | Attending: Internal Medicine | Admitting: Internal Medicine

## 2018-10-10 ENCOUNTER — Other Ambulatory Visit: Payer: Self-pay

## 2018-10-10 DIAGNOSIS — I252 Old myocardial infarction: Secondary | ICD-10-CM | POA: Diagnosis not present

## 2018-10-10 DIAGNOSIS — K5730 Diverticulosis of large intestine without perforation or abscess without bleeding: Secondary | ICD-10-CM | POA: Insufficient documentation

## 2018-10-10 DIAGNOSIS — E039 Hypothyroidism, unspecified: Secondary | ICD-10-CM | POA: Insufficient documentation

## 2018-10-10 DIAGNOSIS — K52832 Lymphocytic colitis: Secondary | ICD-10-CM | POA: Insufficient documentation

## 2018-10-10 DIAGNOSIS — K210 Gastro-esophageal reflux disease with esophagitis: Secondary | ICD-10-CM | POA: Diagnosis not present

## 2018-10-10 DIAGNOSIS — Z7951 Long term (current) use of inhaled steroids: Secondary | ICD-10-CM | POA: Insufficient documentation

## 2018-10-10 DIAGNOSIS — I10 Essential (primary) hypertension: Secondary | ICD-10-CM | POA: Diagnosis not present

## 2018-10-10 DIAGNOSIS — K591 Functional diarrhea: Secondary | ICD-10-CM | POA: Insufficient documentation

## 2018-10-10 DIAGNOSIS — K640 First degree hemorrhoids: Secondary | ICD-10-CM | POA: Insufficient documentation

## 2018-10-10 DIAGNOSIS — R748 Abnormal levels of other serum enzymes: Secondary | ICD-10-CM | POA: Diagnosis not present

## 2018-10-10 DIAGNOSIS — Z7982 Long term (current) use of aspirin: Secondary | ICD-10-CM | POA: Insufficient documentation

## 2018-10-10 DIAGNOSIS — K2950 Unspecified chronic gastritis without bleeding: Secondary | ICD-10-CM | POA: Insufficient documentation

## 2018-10-10 DIAGNOSIS — Z79890 Hormone replacement therapy (postmenopausal): Secondary | ICD-10-CM | POA: Diagnosis not present

## 2018-10-10 DIAGNOSIS — Z79899 Other long term (current) drug therapy: Secondary | ICD-10-CM | POA: Diagnosis not present

## 2018-10-10 DIAGNOSIS — I2510 Atherosclerotic heart disease of native coronary artery without angina pectoris: Secondary | ICD-10-CM | POA: Diagnosis not present

## 2018-10-10 HISTORY — PX: COLONOSCOPY WITH PROPOFOL: SHX5780

## 2018-10-10 HISTORY — DX: Gastro-esophageal reflux disease without esophagitis: K21.9

## 2018-10-10 HISTORY — PX: ESOPHAGOGASTRODUODENOSCOPY (EGD) WITH PROPOFOL: SHX5813

## 2018-10-10 SURGERY — ESOPHAGOGASTRODUODENOSCOPY (EGD) WITH PROPOFOL
Anesthesia: General

## 2018-10-10 MED ORDER — LIDOCAINE HCL (PF) 2 % IJ SOLN
INTRAMUSCULAR | Status: AC
  Filled 2018-10-10: qty 10

## 2018-10-10 MED ORDER — PROPOFOL 500 MG/50ML IV EMUL
INTRAVENOUS | Status: DC | PRN
  Administered 2018-10-10: 180 ug/kg/min via INTRAVENOUS
  Administered 2018-10-10: 120 ug/kg/min via INTRAVENOUS

## 2018-10-10 MED ORDER — LIDOCAINE HCL (CARDIAC) PF 100 MG/5ML IV SOSY
PREFILLED_SYRINGE | INTRAVENOUS | Status: DC | PRN
  Administered 2018-10-10: 100 mg via INTRATRACHEAL

## 2018-10-10 MED ORDER — FENTANYL CITRATE (PF) 100 MCG/2ML IJ SOLN
INTRAMUSCULAR | Status: AC
  Filled 2018-10-10: qty 2

## 2018-10-10 MED ORDER — PROPOFOL 10 MG/ML IV BOLUS
INTRAVENOUS | Status: DC | PRN
  Administered 2018-10-10: 60 mg via INTRAVENOUS

## 2018-10-10 MED ORDER — FENTANYL CITRATE (PF) 100 MCG/2ML IJ SOLN
INTRAMUSCULAR | Status: DC | PRN
  Administered 2018-10-10: 50 ug via INTRAVENOUS

## 2018-10-10 MED ORDER — SODIUM CHLORIDE 0.9 % IV SOLN
INTRAVENOUS | Status: DC
  Administered 2018-10-10: 1000 mL via INTRAVENOUS
  Administered 2018-10-10: 10:00:00 via INTRAVENOUS

## 2018-10-10 MED ORDER — PROPOFOL 10 MG/ML IV BOLUS
INTRAVENOUS | Status: AC
  Filled 2018-10-10: qty 20

## 2018-10-10 MED ORDER — PROPOFOL 500 MG/50ML IV EMUL
INTRAVENOUS | Status: AC
  Filled 2018-10-10: qty 50

## 2018-10-10 NOTE — Anesthesia Preprocedure Evaluation (Signed)
Anesthesia Evaluation  Patient identified by MRN, date of birth, ID band Patient awake    Reviewed: Allergy & Precautions, NPO status , Patient's Chart, lab work & pertinent test results  History of Anesthesia Complications Negative for: history of anesthetic complications  Airway Mallampati: II  TM Distance: >3 FB Neck ROM: Full    Dental  (+) Poor Dentition   Pulmonary neg sleep apnea, neg COPD, former smoker,    breath sounds clear to auscultation- rhonchi (-) wheezing      Cardiovascular Exercise Tolerance: Good hypertension, (-) angina+ CAD, + Past MI and + Cardiac Stents (2011)  (-) CABG  Rhythm:Regular Rate:Normal - Systolic murmurs and - Diastolic murmurs    Neuro/Psych neg Seizures negative neurological ROS  negative psych ROS   GI/Hepatic Neg liver ROS, GERD  ,  Endo/Other  neg diabetesHypothyroidism   Renal/GU negative Renal ROS     Musculoskeletal negative musculoskeletal ROS (+)   Abdominal (+) - obese,   Peds  Hematology negative hematology ROS (+)   Anesthesia Other Findings Past Medical History: No date: Coronary artery disease No date: Erectile disorder due to medical condition in male patient No date: GERD (gastroesophageal reflux disease) No date: Hypertension No date: Hypothyroidism No date: Myocardial infarction [facility])   Reproductive/Obstetrics                             Anesthesia Physical Anesthesia Plan  ASA: III  Anesthesia Plan: General   Post-op Pain Management:    Induction: Intravenous  PONV Risk Score and Plan: 1 and Propofol infusion  Airway Management Planned: Natural Airway  Additional Equipment:   Intra-op Plan:   Post-operative Plan:   Informed Consent: I have reviewed the patients History and Physical, chart, labs and discussed the procedure including the risks, benefits and alternatives for the proposed anesthesia with the patient  or authorized representative who has indicated his/her understanding and acceptance.     Dental advisory given  Plan Discussed with: CRNA and Anesthesiologist  Anesthesia Plan Comments:         Anesthesia Quick Evaluation

## 2018-10-10 NOTE — Transfer of Care (Signed)
Immediate Anesthesia Transfer of Care Note  Patient: Robert Ray  Procedure(s) Performed: ESOPHAGOGASTRODUODENOSCOPY (EGD) WITH PROPOFOL (N/A ) COLONOSCOPY WITH PROPOFOL (N/A )  Patient Location: PACU  Anesthesia Type:General  Level of Consciousness: sedated  Airway & Oxygen Therapy: Patient Spontanous Breathing and Patient connected to nasal cannula oxygen  Post-op Assessment: Report given to RN and Post -op Vital signs reviewed and stable  Post vital signs: Reviewed and stable  Last Vitals:  Vitals Value Taken Time  BP    Temp    Pulse    Resp    SpO2      Last Pain:  Vitals:   10/10/18 0842  TempSrc: Tympanic  PainSc: 0-No pain         Complications: No apparent anesthesia complications

## 2018-10-10 NOTE — Op Note (Addendum)
[facility] Gastroenterology Patient Name: Robert Ray Procedure Date: 10/10/2018 9:46 AM MRN: [id number] Account #: [id number] Date of Birth: 1950/06/14 Admit Type: Outpatient Age: 68 Room: [facility] ENDO ROOM 3 Gender: Male Note Status: Finalized Procedure:            Colonoscopy Indications:          Functional diarrhea Providers:            Benay Pike. [name] MD, MD Referring MD:         Ocie Cornfield. Ouida Sills MD, MD (Referring MD) Medicines:            Propofol per Anesthesia Complications:        No immediate complications. Procedure:            Pre-Anesthesia Assessment:                       - The risks and benefits of the procedure and the                        sedation options and risks were discussed with the                        patient. All questions were answered and informed                        consent was obtained.                       - The risks and benefits of the procedure and the                        sedation options and risks were discussed with the                        patient. All questions were answered and informed                        consent was obtained.                       - Patient identification and proposed procedure were                        verified prior to the procedure by the nurse. The                        procedure was verified in the procedure room.                       - ASA Grade Assessment: III - A patient with severe                        systemic disease.                       - After reviewing the risks and benefits, the patient                        was deemed in satisfactory condition to undergo the  procedure.                       After obtaining informed consent, the colonoscope was                        passed under direct vision. Throughout the procedure,                        the patient's blood pressure, pulse, and oxygen                        saturations were  monitored continuously. The                        Colonoscope was introduced through the anus and                        advanced to the the cecum, identified by appendiceal                        orifice and ileocecal valve. The colonoscopy was                        performed without difficulty. The patient tolerated the                        procedure well. The quality of the bowel preparation                        was adequate. The ileocecal valve, appendiceal orifice,                        and rectum were photographed. Findings:      The perianal and digital rectal examinations were normal. Pertinent       negatives include normal sphincter tone and no palpable rectal lesions.      Multiple small and large-mouthed diverticula were found in the sigmoid       colon.      Normal mucosa was found in the descending colon, in the transverse colon       and in the right colon. Biopsies for histology were taken with a cold       forceps from the random colon for evaluation of microscopic colitis.      A segmental area of mildly erythematous mucosa was found in the sigmoid       colon. Biopsies were taken with a cold forceps for histology.      Non-bleeding internal hemorrhoids were found during retroflexion. The       hemorrhoids were Grade I (internal hemorrhoids that do not prolapse).      The exam was otherwise without abnormality. Impression:           - Diverticulosis in the sigmoid colon.                       - Normal mucosa in the descending colon, in the                        transverse colon and in the right colon. Biopsied.                       -  Erythematous mucosa in the sigmoid colon. Biopsied.                       - Non-bleeding internal hemorrhoids.                       - The examination was otherwise normal. Recommendation:       - Patient has a contact number available for                        emergencies. The signs and symptoms of potential                         delayed complications were discussed with the patient.                        Return to normal activities tomorrow. Written discharge                        instructions were provided to the patient.                       - Await pathology results from EGD, also performed                        today.                       - Resume previous diet.                       - Lactose free diet.                       - Await pathology results.                       - Return to physician assistant in 1 month.                       - The findings and recommendations were discussed with                        the patient. Procedure Code(s):    --- Professional ---                       [phone], Colonoscopy, flexible; with biopsy, single or                        multiple Diagnosis Code(s):    --- Professional ---                       K57.30, Diverticulosis of large intestine without                        perforation or abscess without bleeding                       K59.1, Functional diarrhea                       K63.89, Other specified diseases of intestine  K64.0, First degree hemorrhoids CPT copyright 2019 American Medical Association. All rights reserved. The codes documented in this report are preliminary and upon coder review may  be revised to meet current compliance requirements. Efrain Sella MD, MD 10/10/2018 10:32:15 AM This report has been signed electronically. Number of Addenda: 0 Note Initiated On: 10/10/2018 9:46 AM Scope Withdrawal Time: 0 hours 7 minutes 46 seconds  Total Procedure Duration: 0 hours 10 minutes 48 seconds  Estimated Blood Loss: Estimated blood loss was minimal.      [facility]

## 2018-10-10 NOTE — Anesthesia Postprocedure Evaluation (Signed)
Anesthesia Post Note  Patient: LAYMAN ALTIER  Procedure(s) Performed: ESOPHAGOGASTRODUODENOSCOPY (EGD) WITH PROPOFOL (N/A ) COLONOSCOPY WITH PROPOFOL (N/A )  Patient location during evaluation: Endoscopy Anesthesia Type: General Level of consciousness: awake and alert and oriented Pain management: pain level controlled Vital Signs Assessment: post-procedure vital signs reviewed and stable Respiratory status: spontaneous breathing, nonlabored ventilation and respiratory function stable Cardiovascular status: blood pressure returned to baseline and stable Postop Assessment: no signs of nausea or vomiting Anesthetic complications: no     Last Vitals:  Vitals:   10/10/18 0842 10/10/18 1040  BP: 118/81 115/78  Pulse: 63 (!) 57  Resp: 18 (!) 24  Temp: 37.1 C (!) 36.2 C  SpO2: 97% 96%    Last Pain:  Vitals:   10/10/18 1110  TempSrc:   PainSc: 0-No pain                 Cheng Dec

## 2018-10-10 NOTE — Op Note (Addendum)
[facility] Gastroenterology Patient Name: Robert Ray Procedure Date: 10/10/2018 9:47 AM MRN: [id number] Account #: [id number] Date of Birth: 1950-11-04 Admit Type: Outpatient Age: 68 Room: [facility] ENDO ROOM 3 Gender: Male Note Status: Finalized Procedure:            Upper GI endoscopy Indications:          Follow-up of esophageal reflux, Endoscopy to assess                        diarrhea in patient suspected of having celiac disease Providers:            Benay Pike. [name] MD, MD Referring MD:         Ocie Cornfield. Ouida Sills MD, MD (Referring MD) Medicines:            Propofol per Anesthesia Complications:        No immediate complications. Procedure:            Pre-Anesthesia Assessment:                       - The risks and benefits of the procedure and the                        sedation options and risks were discussed with the                        patient. All questions were answered and informed                        consent was obtained.                       - Patient identification and proposed procedure were                        verified prior to the procedure by the nurse. The                        procedure was verified in the procedure room.                       - ASA Grade Assessment: III - A patient with severe                        systemic disease.                       - After reviewing the risks and benefits, the patient                        was deemed in satisfactory condition to undergo the                        procedure.                       After obtaining informed consent, the endoscope was                        passed under direct vision. Throughout the procedure,  the patient's blood pressure, pulse, and oxygen                        saturations were monitored continuously. The Endoscope                        was introduced through the mouth, and advanced to the                        third part of  duodenum. The upper GI endoscopy was                        accomplished without difficulty. The patient tolerated                        the procedure well. The upper GI endoscopy was                        accomplished without difficulty. The patient tolerated                        the procedure well. Findings:      Diffuse mild mucosal changes characterized by altered texture were found       in the entire esophagus. Biopsies were obtained from the proximal and       distal esophagus with cold forceps for histology of suspected       eosinophilic esophagitis.      The Z-line was regular and was found 38 cm from the incisors.      Patchy minimal inflammation characterized by erythema was found in the       stomach. Biopsies were taken with a cold forceps for Helicobacter pylori       testing.      The cardia and gastric fundus were normal on retroflexion.      The examined duodenum was normal. Biopsies for histology were taken with       a cold forceps for evaluation of celiac disease.      The exam was otherwise without abnormality. Impression:           - Texture changed mucosa in the esophagus. Biopsied.                       - Z-line regular, 38 cm from the incisors.                       - Gastritis. Biopsied.                       - Normal examined duodenum. Biopsied.                       - The examination was otherwise normal. Recommendation:       - Await pathology results.                       - Proceed with colonoscopy Procedure Code(s):    --- Professional ---                       [phone], Esophagogastroduodenoscopy, flexible, transoral;  with biopsy, single or multiple Diagnosis Code(s):    --- Professional ---                       R19.7, Diarrhea, unspecified                       K21.9, Gastro-esophageal reflux disease without                        esophagitis                       K29.70, Gastritis, unspecified, without bleeding                        K22.8, Other specified diseases of esophagus CPT copyright 2019 American Medical Association. All rights reserved. The codes documented in this report are preliminary and upon coder review may  be revised to meet current compliance requirements. Efrain Sella MD, MD 10/10/2018 10:13:19 AM This report has been signed electronically. Number of Addenda: 0 Note Initiated On: 10/10/2018 9:47 AM Estimated Blood Loss: Estimated blood loss: none.      [facility]

## 2018-10-10 NOTE — Anesthesia Post-op Follow-up Note (Signed)
Anesthesia QCDR form completed.        

## 2018-10-10 NOTE — Anesthesia Procedure Notes (Signed)
Date/Time: 10/10/2018 10:00 AM Performed by: Allean Found, CRNA Pre-anesthesia Checklist: Patient identified, Emergency Drugs available, Suction available, Patient being monitored and Timeout performed Oxygen Delivery Method: Nasal cannula Placement Confirmation: positive ETCO2

## 2018-10-11 ENCOUNTER — Encounter: Payer: Self-pay | Admitting: Internal Medicine

## 2018-10-12 LAB — SURGICAL PATHOLOGY

## 2019-03-22 ENCOUNTER — Other Ambulatory Visit: Payer: Self-pay | Admitting: Student

## 2019-03-22 DIAGNOSIS — K529 Noninfective gastroenteritis and colitis, unspecified: Secondary | ICD-10-CM

## 2019-03-26 ENCOUNTER — Other Ambulatory Visit
Admission: RE | Admit: 2019-03-26 | Discharge: 2019-03-26 | Disposition: A | Discharge status: Home / Self Care | Payer: Medicare Other | Source: Ambulatory Visit | Attending: Student | Admitting: Student

## 2019-03-26 DIAGNOSIS — K529 Noninfective gastroenteritis and colitis, unspecified: Secondary | ICD-10-CM | POA: Insufficient documentation

## 2019-03-26 LAB — C DIFFICILE QUICK SCREEN W PCR REFLEX
C Diff antigen: NEGATIVE
C Diff interpretation: NOT DETECTED
C Diff toxin: NEGATIVE

## 2019-03-27 ENCOUNTER — Ambulatory Visit: Payer: Medicare Other

## 2019-03-27 ENCOUNTER — Ambulatory Visit
Admission: RE | Admit: 2019-03-27 | Discharge: 2019-03-27 | Disposition: A | Discharge status: Home / Self Care | Payer: Medicare Other | Source: Ambulatory Visit | Attending: Student | Admitting: Student

## 2019-03-27 ENCOUNTER — Other Ambulatory Visit: Payer: Self-pay

## 2019-03-27 ENCOUNTER — Other Ambulatory Visit
Admission: RE | Admit: 2019-03-27 | Discharge: 2019-03-27 | Disposition: A | Discharge status: Home / Self Care | Payer: Medicare Other | Source: Home / Self Care | Attending: Student | Admitting: Student

## 2019-03-27 DIAGNOSIS — K529 Noninfective gastroenteritis and colitis, unspecified: Secondary | ICD-10-CM | POA: Diagnosis present

## 2019-03-27 LAB — CREATININE, SERUM
Creatinine, Ser: 1.14 mg/dL (ref 0.61–1.24)
GFR calc Af Amer: >60 mL/min (ref >60)
GFR calc non Af Amer: >60 mL/min (ref >60)

## 2019-03-27 MED ORDER — IOHEXOL 300 MG/ML  SOLN
100.0000 mL | Freq: Once | INTRAMUSCULAR | Status: AC | PRN
  Administered 2019-03-27: 100 mL via INTRAVENOUS

## 2019-03-28 LAB — OVA + PARASITE EXAM

## 2019-03-28 LAB — O&P RESULT

## 2019-03-30 LAB — STOOL CULTURE: E coli, Shiga toxin Assay: NEGATIVE

## 2019-03-30 LAB — STOOL CULTURE REFLEX - RSASHR

## 2019-03-30 LAB — STOOL CULTURE REFLEX - CMPCXR

## 2019-04-04 LAB — CALPROTECTIN, FECAL: Calprotectin, Fecal: 73 ug/g (ref 0–120)

## 2019-04-04 LAB — PANCREATIC ELASTASE, FECAL: Pancreatic Elastase-1, Stool: 268 ug Elast./g (ref >200)

## 2019-10-02 ENCOUNTER — Other Ambulatory Visit: Payer: Medicare Other

## 2019-12-09 ENCOUNTER — Encounter: Payer: Self-pay | Admitting: Physical Therapy

## 2019-12-09 ENCOUNTER — Ambulatory Visit: Payer: Medicare Other | Attending: Internal Medicine | Admitting: Physical Therapy

## 2019-12-09 ENCOUNTER — Other Ambulatory Visit: Payer: Self-pay

## 2019-12-09 DIAGNOSIS — M2560 Stiffness of unspecified joint, not elsewhere classified: Secondary | ICD-10-CM

## 2019-12-09 DIAGNOSIS — M6281 Muscle weakness (generalized): Secondary | ICD-10-CM | POA: Insufficient documentation

## 2019-12-09 DIAGNOSIS — M25551 Pain in right hip: Secondary | ICD-10-CM | POA: Diagnosis not present

## 2019-12-09 NOTE — Therapy (Signed)
[facility] [address] [place], [place], [address] Phone: [phone]   Fax:  [phone]  Physical Therapy Evaluation  Patient Details  Name: GUNTER CONDE MRN: [id number] Date of Birth: 1950/05/30 Referring Provider (PT): Caleen Essex, MD   Encounter Date: 12/09/2019   PT End of Session - 12/09/19 1108    Visit Number 1    Number of Visits 9    Date for PT Re-Evaluation 01/06/20    Authorization - Visit Number 1    Authorization - Number of Visits 10    PT Start Time 0815    PT Stop Time 0902    PT Time Calculation (min) 47 min    Activity Tolerance Patient tolerated treatment well    Behavior During Therapy [facility] for tasks assessed/performed           Past Medical History:  Diagnosis Date  . Coronary artery disease   . Erectile disorder due to medical condition in male patient   . GERD (gastroesophageal reflux disease)   . Hypertension   . Hypothyroidism   . Myocardial infarction [facility])     Past Surgical History:  Procedure Laterality Date  . COLONOSCOPY WITH PROPOFOL N/A 05/08/2015   Procedure: COLONOSCOPY WITH PROPOFOL;  Surgeon: Manya Silvas, MD;  Location: [facility] ENDOSCOPY;  Service: Endoscopy;  Laterality: N/A;  . COLONOSCOPY WITH PROPOFOL N/A 10/10/2018   Procedure: COLONOSCOPY WITH PROPOFOL;  Surgeon: Toledo, Benay Pike, MD;  Location: [facility] ENDOSCOPY;  Service: Gastroenterology;  Laterality: N/A;  . CORONARY ANGIOPLASTY    . ESOPHAGOGASTRODUODENOSCOPY (EGD) WITH PROPOFOL N/A 04/07/2017   Procedure: ESOPHAGOGASTRODUODENOSCOPY (EGD) WITH PROPOFOL;  Surgeon: Manya Silvas, MD;  Location[facility] ENDOSCOPY;  Service: Endoscopy;  Laterality: N/A;  . ESOPHAGOGASTRODUODENOSCOPY (EGD) WITH PROPOFOL N/A 10/10/2018   Procedure: ESOPHAGOGASTRODUODENOSCOPY (EGD) WITH PROPOFOL;  Surgeon: Toledo, Benay Pike, MD;  Location: [facility] ENDOSCOPY;  Service: Gastroenterology;  Laterality: N/A;  . HEMORRHOIDECTOMY WITH  HEMORRHOID BANDING      There were no vitals filed for this visit.    Subjective Assessment - 12/09/19 1109    Subjective Pt. states that 3 weeks ago he felt a "twinge" in his R hip. Pt. states since then, his hip has been on and off painful with certain motions such as with turning or having to bend down to tie shoes occasionally. Pt. states that when the pain comes on, it's a "10/10, stop you in your tracks" kind of pain. Pt. states that when he is walking straight forward, he feels good. Pt. states that sleeping on his R side is painful. Pt. states he feels some stiffness in the mornings for approx 5-10 mins after getting out of bed, but moving around and a warm shower help to "loosen things up." Pt. states that he has seen an MD, and they took xrays and diagnosed his R hip with arthritis. He states that he does not feel any clicking, catching, or popping in the hip.    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 No/denies          OBJECTIVE  AROM:  Slightly limited hip flexion bilat. Hip IR: L: 34 deg, R: 20 deg Remaining hip and LE AROM WFL  Strength R/L 4+/4+ Hip flexion 5/5 Hip external rotation 5/5 Hip internal rotation 5/5 Hip extension  5/5 Hip abduction (seated). In sidelying,  4/4+ hip abduction. 5/5 Hip adduction 5/5 Knee extension 5/5 Knee flexion *indicates pain  Special Tests: FABER: guarded and limited on R compared to L FADDIR: limited R side Scour: guarded, not painful Squat: painful on R when equal weightbearing        Objective measurements completed on examination: See above findings.    See HEP     PT Long Term Goals - 12/09/19 1144      PT LONG TERM GOAL #1   Title Pt. will increase FOTO score to 61 to improve pain free functional mobility.    Baseline 10/25: 31    Time 4    Period Weeks    Status New    Target  Date 01/06/20      PT LONG TERM GOAL #2   Title Pt. will improve R hip abductor strength to at least 4+/5 to improve functional mobility.    Baseline 10/25: sidelying R hip abd: 4/5    Time 4    Period Weeks    Status New    Target Date 01/06/20      PT LONG TERM GOAL #3   Title Pt. will report being able to sleep on R side for more than 10 mins without pain to improve pain free mobility.    Baseline 10/25: pt reports pain in right hip after sidelying on R side for 10 mins    Time 4    Period Weeks    Status New    Target Date 01/06/20      PT LONG TERM GOAL #4   Title Pt. will demonstrate ability to complete pain free squats to improve work realted functional mobility.    Baseline 10/25: pt demonstrates increased pain with squats    Time 4    Period Weeks    Status New    Target Date 01/06/20                  Plan - 12/09/19 1210    Clinical Impression Statement Pt. is a 69 y.o. male with R hip pain. Pt. has received xrays, was diagnosed with hip arthritis. Pt. demonstrates pain with squats and turning motions. Pt. demonstrates decreased strength in hip flexors and sidelying abd: (R/L out of 5) R hip flex: 4+/4+, sidelying hip abd: 4/4+. Pt. demonstrates decreased hip IR ROM bilat, R: 20 deg, L: 34 deg. Pt. completed FOTO, scored 31/61. Pt. demonstrates guarded/limited FABER and FADDIR on R hip. Pt. will benefit from skilled PT to improve strength and ROM to decrease pain in R hip to improve pain free work days.    Personal Factors and Comorbidities Fitness;Profession    Examination-Activity Limitations Bed Mobility;Squat;Stairs;Sleep    Examination-Participation Restrictions Community Activity;Occupation    Stability/Clinical Decision Making Stable/Uncomplicated    Clinical Decision Making Low    Rehab Potential Good    PT Frequency 2x / week    PT Duration 4 weeks    PT Treatment/Interventions ADLs/Self Care Home Management;Cryotherapy;Iontophoresis 4mg /ml  Dexamethasone;Gait training;Stair training;Functional mobility training;Therapeutic activities;Therapeutic exercise;Balance training;Neuromuscular re-education;Patient/family education;Manual techniques    PT Next Visit Plan glute med strengthening/ hip mobs    Consulted and Agree with Plan of Care Patient           Patient will benefit from skilled therapeutic intervention in order to improve the following deficits and impairments:  Decreased activity tolerance, Decreased endurance, Decreased range of motion, Decreased mobility, Decreased strength, Hypomobility, Pain  Visit Diagnosis: Pain in right hip  Joint stiffness  Muscle weakness (generalized)     Problem List There are no problems to display for this patient.  Pura Spice, PT, DPT # [id number], SPT 12/10/2019, 10:27 AM  [place] [facility] [address] [place], [place], [address] Phone: [phone]   Fax:  [phone]  Name: ADARSH MUNDORF MRN: [id number] Date of Birth: 1950/03/04

## 2019-12-11 ENCOUNTER — Encounter: Payer: Self-pay | Admitting: Physical Therapy

## 2019-12-11 ENCOUNTER — Other Ambulatory Visit: Payer: Self-pay

## 2019-12-11 ENCOUNTER — Ambulatory Visit: Payer: Medicare Other | Admitting: Physical Therapy

## 2019-12-11 DIAGNOSIS — M25551 Pain in right hip: Secondary | ICD-10-CM | POA: Diagnosis not present

## 2019-12-11 DIAGNOSIS — M6281 Muscle weakness (generalized): Secondary | ICD-10-CM

## 2019-12-11 DIAGNOSIS — M2560 Stiffness of unspecified joint, not elsewhere classified: Secondary | ICD-10-CM

## 2019-12-11 NOTE — Therapy (Signed)
[facility] [address] [place], [place], [address] Phone: [phone]   Fax:  [phone]  Physical Therapy Treatment  Patient Details  Name: Robert Ray MRN: [id number] Date of Birth: 1950-07-22 Referring Provider (PT): Caleen Essex, MD   Encounter Date: 12/11/2019   PT End of Session - 12/11/19 0727    Visit Number 2    Number of Visits 9    Date for PT Re-Evaluation 01/06/20    Authorization - Visit Number 2    Authorization - Number of Visits 10    PT Start Time 0723    PT Stop Time 0805    PT Time Calculation (min) 42 min    Activity Tolerance Patient tolerated treatment well    Behavior During Therapy [facility] for tasks assessed/performed           Past Medical History:  Diagnosis Date  . Coronary artery disease   . Erectile disorder due to medical condition in male patient   . GERD (gastroesophageal reflux disease)   . Hypertension   . Hypothyroidism   . Myocardial infarction [facility])     Past Surgical History:  Procedure Laterality Date  . COLONOSCOPY WITH PROPOFOL N/A 05/08/2015   Procedure: COLONOSCOPY WITH PROPOFOL;  Surgeon: Manya Silvas, MD;  Location: [facility] ENDOSCOPY;  Service: Endoscopy;  Laterality: N/A;  . COLONOSCOPY WITH PROPOFOL N/A 10/10/2018   Procedure: COLONOSCOPY WITH PROPOFOL;  Surgeon: Toledo, Benay Pike, MD;  Location: [facility] ENDOSCOPY;  Service: Gastroenterology;  Laterality: N/A;  . CORONARY ANGIOPLASTY    . ESOPHAGOGASTRODUODENOSCOPY (EGD) WITH PROPOFOL N/A 04/07/2017   Procedure: ESOPHAGOGASTRODUODENOSCOPY (EGD) WITH PROPOFOL;  Surgeon: Manya Silvas, MD;  Location: [facility] ENDOSCOPY;  Service: Endoscopy;  Laterality: N/A;  . ESOPHAGOGASTRODUODENOSCOPY (EGD) WITH PROPOFOL N/A 10/10/2018   Procedure: ESOPHAGOGASTRODUODENOSCOPY (EGD) WITH PROPOFOL;  Surgeon: Toledo, Benay Pike, MD;  Location: [facility] ENDOSCOPY;  Service: Gastroenterology;  Laterality: N/A;  . HEMORRHOIDECTOMY WITH  HEMORRHOID BANDING      There were no vitals filed for this visit.   Subjective Assessment - 12/11/19 0724    Subjective Pt. states that after evaluation, he felt really good. Pt. states he worked yesterday, and he was "feeling" his hip by the end of the day, 7/10.    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 No/denies            There Ex:  Nustep L4 10 mins (unbilled)  Supine hamstring stretches with strap: 3x30s each leg. Cueing to maintain knee extension during  R hip abd sidelying: 3lb ankle weight. 3x10. Verbal and tactile cueing for proper form  Lateral step up to 6" step: 10x each direction. Cueing for proper hip hinge.  Single leg squats from raised table: 3x10. Pt instructed to maintain toe touch weightbearing with other leg. Cueing for less weight on L leg when completing R single leg squat.   Tall kneeling on mat with trunk rotations holding 4lb weighted ball: pt required cueing to keep abs tight and slow down exercise. 3x10  Manual:   Long axis distraction: 3x30s on R hip       PT Long Term Goals - 12/09/19 1144      PT LONG TERM GOAL #1   Title Pt. will increase FOTO score to 61 to improve pain free functional mobility.    Baseline 10/25: 31  Time 4    Period Weeks    Status New    Target Date 01/06/20      PT LONG TERM GOAL #2   Title Pt. will improve R hip abductor strength to at least 4+/5 to improve functional mobility.    Baseline 10/25: sidelying R hip abd: 4/5    Time 4    Period Weeks    Status New    Target Date 01/06/20      PT LONG TERM GOAL #3   Title Pt. will report being able to sleep on R side for more than 10 mins without pain to improve pain free mobility.    Baseline 10/25: pt reports pain in right hip after sidelying on R side for 10 mins    Time 4    Period Weeks    Status New    Target Date  01/06/20      PT LONG TERM GOAL #4   Title Pt. will demonstrate ability to complete pain free squats to improve work realted functional mobility.    Baseline 10/25: pt demonstrates increased pain with squats    Time 4    Period Weeks    Status New    Target Date 01/06/20                 Plan - 12/11/19 0756    Clinical Impression Statement Pt. returns to therapy with decreased pain overall. Pt. completed several glute med strengthening exercises including sidleying abd and single leg squats. Pt. required cueing for maintaining proper hip hinging for squats. Pt. completed tall kneeling abdominal strengthening. Pt. will continue to benefit from skilled PT to improve strength and ROM to decrease pain in R hip and improve pain free work days.    Personal Factors and Comorbidities Fitness;Profession    Examination-Activity Limitations Bed Mobility;Squat;Stairs;Sleep    Examination-Participation Restrictions Community Activity;Occupation    Stability/Clinical Decision Making Stable/Uncomplicated    Clinical Decision Making Low    Rehab Potential Good    PT Frequency 2x / week    PT Duration 4 weeks    PT Treatment/Interventions ADLs/Self Care Home Management;Cryotherapy;Iontophoresis 4mg /ml Dexamethasone;Gait training;Stair training;Functional mobility training;Therapeutic activities;Therapeutic exercise;Balance training;Neuromuscular re-education;Patient/family education;Manual techniques    PT Next Visit Plan glute med strengthening/ hip mobs    Consulted and Agree with Plan of Care Patient           Patient will benefit from skilled therapeutic intervention in order to improve the following deficits and impairments:  Decreased activity tolerance, Decreased endurance, Decreased range of motion, Decreased mobility, Decreased strength, Hypomobility, Pain  Visit Diagnosis: Pain in right hip  Joint stiffness  Muscle weakness (generalized)     Problem List There are no  problems to display for this patient.  Pura Spice, PT, DPT # [id number], SPT 12/11/2019, 11:44 AM  [facility] [address] [place], [place], [address] Phone: [phone]   Fax:  [phone]  Name: Robert Ray MRN: [id number] Date of Birth: 03/04/1950

## 2019-12-16 ENCOUNTER — Encounter: Payer: Self-pay | Admitting: Physical Therapy

## 2019-12-16 ENCOUNTER — Other Ambulatory Visit: Payer: Self-pay

## 2019-12-16 ENCOUNTER — Ambulatory Visit: Payer: Medicare Other | Attending: Internal Medicine | Admitting: Physical Therapy

## 2019-12-16 DIAGNOSIS — M2560 Stiffness of unspecified joint, not elsewhere classified: Secondary | ICD-10-CM | POA: Insufficient documentation

## 2019-12-16 DIAGNOSIS — M25551 Pain in right hip: Secondary | ICD-10-CM | POA: Insufficient documentation

## 2019-12-16 DIAGNOSIS — M6281 Muscle weakness (generalized): Secondary | ICD-10-CM

## 2019-12-16 NOTE — Therapy (Signed)
[facility] [address] [place], [place], [address] Phone: [phone]   Fax:  [phone]  Physical Therapy Treatment  Patient Details  Name: Robert Ray MRN: [id number] Date of Birth: 11-Jul-1950 Referring Provider (PT): Caleen Essex, MD   Encounter Date: 12/16/2019   PT End of Session - 12/16/19 0732    Visit Number 3    Number of Visits 9    Date for PT Re-Evaluation 01/06/20    Authorization - Visit Number 3    Authorization - Number of Visits 10    PT Start Time 0728    PT Stop Time 0812    PT Time Calculation (min) 44 min    Activity Tolerance Patient tolerated treatment well    Behavior During Therapy [facility] for tasks assessed/performed           Past Medical History:  Diagnosis Date  . Coronary artery disease   . Erectile disorder due to medical condition in male patient   . GERD (gastroesophageal reflux disease)   . Hypertension   . Hypothyroidism   . Myocardial infarction [facility])     Past Surgical History:  Procedure Laterality Date  . COLONOSCOPY WITH PROPOFOL N/A 05/08/2015   Procedure: COLONOSCOPY WITH PROPOFOL;  Surgeon: Manya Silvas, MD;  Location: [facility] ENDOSCOPY;  Service: Endoscopy;  Laterality: N/A;  . COLONOSCOPY WITH PROPOFOL N/A 10/10/2018   Procedure: COLONOSCOPY WITH PROPOFOL;  Surgeon: Toledo, Benay Pike, MD;  Location: [facility] ENDOSCOPY;  Service: Gastroenterology;  Laterality: N/A;  . CORONARY ANGIOPLASTY    . ESOPHAGOGASTRODUODENOSCOPY (EGD) WITH PROPOFOL N/A 04/07/2017   Procedure: ESOPHAGOGASTRODUODENOSCOPY (EGD) WITH PROPOFOL;  Surgeon: Manya Silvas, MD;  Location[facility] ENDOSCOPY;  Service: Endoscopy;  Laterality: N/A;  . ESOPHAGOGASTRODUODENOSCOPY (EGD) WITH PROPOFOL N/A 10/10/2018   Procedure: ESOPHAGOGASTRODUODENOSCOPY (EGD) WITH PROPOFOL;  Surgeon: Toledo, Benay Pike, MD;  Location: [facility] ENDOSCOPY;  Service: Gastroenterology;  Laterality: N/A;  . HEMORRHOIDECTOMY WITH  HEMORRHOID BANDING      There were no vitals filed for this visit.   Subjective Assessment - 12/16/19 0731    Subjective Pt reports he feels better after PT sessions. No pain this morning in R hip. Reports minor aches and pains in R low back after standing at work for a period of time but overall his hip is improving.    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 No/denies          There.ex:   Sci-Fit: 6 min L6, UE/LE use:   R sidelying hip abduction: 4 lbs AW's: 3x15. Min verbal and tactile cues for form/technique   B 6" step down with UE assist: 3x15, mod verbal cues for reducing knee valgus. Last set on RLE performed with red TB as tactile cue to reduce R knee valgus with good carryover  Total Gym squat: 2x20. Min verbal cues for form/technique   Educated on lumbar mobility exercises due to reports of LBP at work.    Manual Therapy:   R long arc distraction: 3x30 sec in supine   R AP grade 3 hip mobs for improve hip flexion mobility: 4x30 sec   PT Education - 12/16/19 0732    Education Details form/technique with exercise    Person(s) Educated Patient    Methods Explanation;Demonstration;Tactile cues;Verbal cues    Comprehension Verbalized understanding;Returned demonstration  PT Long Term Goals - 12/09/19 1144      PT LONG TERM GOAL #1   Title Pt. will increase FOTO score to 61 to improve pain free functional mobility.    Baseline 10/25: 31    Time 4    Period Weeks    Status New    Target Date 01/06/20      PT LONG TERM GOAL #2   Title Pt. will improve R hip abductor strength to at least 4+/5 to improve functional mobility.    Baseline 10/25: sidelying R hip abd: 4/5    Time 4    Period Weeks    Status New    Target Date 01/06/20      PT LONG TERM GOAL #3   Title Pt. will report being able to sleep on R side for more  than 10 mins without pain to improve pain free mobility.    Baseline 10/25: pt reports pain in right hip after sidelying on R side for 10 mins    Time 4    Period Weeks    Status New    Target Date 01/06/20      PT LONG TERM GOAL #4   Title Pt. will demonstrate ability to complete pain free squats to improve work realted functional mobility.    Baseline 10/25: pt demonstrates increased pain with squats    Time 4    Period Weeks    Status New    Target Date 01/06/20                 Plan - 12/16/19 5093    Clinical Impression Statement Pt progressed in resisted therex with glut med focus. Noted hip IR and adduction with single leg step downs and total gym squats indicative of hip abductor and ER weakness. Use of theraband and therapist hands as tactile cues to reduce compensations with good carryover. Pt can continue to benefit from further skilled PT treatment to improve functional mobility.    Personal Factors and Comorbidities Fitness;Profession    Examination-Activity Limitations Bed Mobility;Squat;Stairs;Sleep    Examination-Participation Restrictions Community Activity;Occupation    Stability/Clinical Decision Making Stable/Uncomplicated    Clinical Decision Making Low    Rehab Potential Good    PT Frequency 2x / week    PT Duration 4 weeks    PT Treatment/Interventions ADLs/Self Care Home Management;Cryotherapy;Iontophoresis 4mg /ml Dexamethasone;Gait training;Stair training;Functional mobility training;Therapeutic activities;Therapeutic exercise;Balance training;Neuromuscular re-education;Patient/family education;Manual techniques    PT Next Visit Plan R hip manual therapy for hip OA. Load glut med. Progress HEP.    Consulted and Agree with Plan of Care Patient           Patient will benefit from skilled therapeutic intervention in order to improve the following deficits and impairments:  Decreased activity tolerance, Decreased endurance, Decreased range of motion,  Decreased mobility, Decreased strength, Hypomobility, Pain  Visit Diagnosis: Pain in right hip  Joint stiffness  Muscle weakness (generalized)     Problem List There are no problems to display for this patient.  Pura Spice, PT, DPT # [id number] Larna Daughters, SPT 12/17/2019, 7:41 AM  [facility] [address] [place], [place], [address] Phone: [phone]   Fax:  [phone]  Name: CORDELL GUERCIO MRN: [id number] Date of Birth: 09/22/1950

## 2019-12-18 ENCOUNTER — Encounter: Payer: Self-pay | Admitting: Physical Therapy

## 2019-12-18 ENCOUNTER — Other Ambulatory Visit: Payer: Self-pay

## 2019-12-18 ENCOUNTER — Ambulatory Visit: Payer: Medicare Other

## 2019-12-18 DIAGNOSIS — M25551 Pain in right hip: Secondary | ICD-10-CM

## 2019-12-18 DIAGNOSIS — M2560 Stiffness of unspecified joint, not elsewhere classified: Secondary | ICD-10-CM

## 2019-12-18 DIAGNOSIS — M6281 Muscle weakness (generalized): Secondary | ICD-10-CM

## 2019-12-18 NOTE — Therapy (Signed)
[place] [address] [place], [place], [address] Phone: [phone]   Fax:  [phone]  Physical Therapy Treatment  Patient Details  Name: Robert Ray MRN: [id number] Date of Birth: [birth year] Referring Provider (PT): Caleen Essex, MD   Encounter Date: 12/18/2019   PT End of Session - 12/18/19 0726    Visit Number 4    Number of Visits 9    Date for PT Re-Evaluation 01/06/20    Authorization - Visit Number 4    Authorization - Number of Visits 10    PT Start Time 0725    PT Stop Time 0808    PT Time Calculation (min) 43 min    Activity Tolerance Patient tolerated treatment well    Behavior During Therapy [facility] for tasks assessed/performed           Past Medical History:  Diagnosis Date  . Coronary artery disease   . Erectile disorder due to medical condition in male patient   . GERD (gastroesophageal reflux disease)   . Hypertension   . Hypothyroidism   . Myocardial infarction [facility])     Past Surgical History:  Procedure Laterality Date  . COLONOSCOPY WITH PROPOFOL N/A 05/08/2015   Procedure: COLONOSCOPY WITH PROPOFOL;  Surgeon: Manya Silvas, MD;  Location: [facility] ENDOSCOPY;  Service: Endoscopy;  Laterality: N/A;  . COLONOSCOPY WITH PROPOFOL N/A 10/10/2018   Procedure: COLONOSCOPY WITH PROPOFOL;  Surgeon: Toledo, Benay Pike, MD;  Location: [facility] ENDOSCOPY;  Service: Gastroenterology;  Laterality: N/A;  . CORONARY ANGIOPLASTY    . ESOPHAGOGASTRODUODENOSCOPY (EGD) WITH PROPOFOL N/A 04/07/2017   Procedure: ESOPHAGOGASTRODUODENOSCOPY (EGD) WITH PROPOFOL;  Surgeon: Manya Silvas, MD;  Location: [facility] ENDOSCOPY;  Service: Endoscopy;  Laterality: N/A;  . ESOPHAGOGASTRODUODENOSCOPY (EGD) WITH PROPOFOL N/A 10/10/2018   Procedure: ESOPHAGOGASTRODUODENOSCOPY (EGD) WITH PROPOFOL;  Surgeon: Toledo, Benay Pike, MD;  Location: [facility] ENDOSCOPY;  Service: Gastroenterology;  Laterality: N/A;  . HEMORRHOIDECTOMY WITH  HEMORRHOID BANDING      There were no vitals filed for this visit.   Subjective Assessment - 12/18/19 0726    Subjective Pt. reports that working on his knees is very painful for his hips due to the getting up and down.    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 No/denies            There Ex:  Nustep 10 mins L4 (unbilled)  Standing to floor transfer: 10x, pt utilizes L leg for kneeling to push up. PT instructed pt to use R knee, pt able to complete 5x with R leg to push up to standing.   R leg forward kneeling rebounder: pt able to complete 3x10-15 tosses with up to 8lb med ball. Pt cued for moving R LE to midline and for bringing R foot more forward.   STS practice: pt completed approx 20 STS from various heights, lowest height being approx 12". Pt able to demonstrate good form with knee positioning, required mild verbal cueing for keeping R LE equally weighted.   R LE step ups onto tall surfaces: Pt initially stepping up onto 12" step, progressed to approx 20" box. Pt required cueing for putting more weight into R LE and maintaining proper knee positioning, as well as keeping hips square.   Education provided for doing more squats or hip flexion related activities throughout the day at his  job to maintain gains made in therapy.   Manual:   Long axis distraction R LE: 3x30s   Lateral R hip glide with mobilization belt in supine: 3x30s, grade 4. PT provided passive ER and IR to hip throughout mobilizations.                                 PT Long Term Goals - 12/09/19 1144      PT LONG TERM GOAL #1   Title Pt. will increase FOTO score to 61 to improve pain free functional mobility.    Baseline 10/25: 31    Time 4    Period Weeks    Status New    Target Date 01/06/20      PT LONG TERM GOAL #2   Title Pt. will improve  R hip abductor strength to at least 4+/5 to improve functional mobility.    Baseline 10/25: sidelying R hip abd: 4/5    Time 4    Period Weeks    Status New    Target Date 01/06/20      PT LONG TERM GOAL #3   Title Pt. will report being able to sleep on R side for more than 10 mins without pain to improve pain free mobility.    Baseline 10/25: pt reports pain in right hip after sidelying on R side for 10 mins    Time 4    Period Weeks    Status New    Target Date 01/06/20      PT LONG TERM GOAL #4   Title Pt. will demonstrate ability to complete pain free squats to improve work realted functional mobility.    Baseline 10/25: pt demonstrates increased pain with squats    Time 4    Period Weeks    Status New    Target Date 01/06/20                 Plan - 12/18/19 0814    Clinical Impression Statement Pt. responded well to manual R hip distraction and lateral glides. Session today focused on ability to complete activities in deep R hip flexion such as kneeling weighted ball tosses and step ups onto tall surfaces with R LE. Pt. additionally demonstrated improved ability to complete STS from low surfaces (12"). Pt. demonstrated pain free ROM in these positions and instructed on integrating squats/kneeling into his work day to maintain gains made in therapy. Throughout all activities, pt required mild verbal and tactile cueing to maintain R LE equally weighted and for proper knee positioning during squats and step up activities for RLE. Pt. will continue to benefit from skilled PT to improve ability to complete work related tasks without pain.    Personal Factors and Comorbidities Fitness;Profession    Examination-Activity Limitations Bed Mobility;Squat;Stairs;Sleep    Examination-Participation Restrictions Community Activity;Occupation    Stability/Clinical Decision Making Stable/Uncomplicated    Clinical Decision Making Low    Rehab Potential Good    PT Frequency 2x / week    PT  Duration 4 weeks    PT Treatment/Interventions ADLs/Self Care Home Management;Cryotherapy;Iontophoresis 4mg /ml Dexamethasone;Gait training;Stair training;Functional mobility training;Therapeutic activities;Therapeutic exercise;Balance training;Neuromuscular re-education;Patient/family education;Manual techniques    PT Next Visit Plan R hip manual therapy for hip OA. Load glut med. Progress HEP.    Consulted and Agree with Plan of Care Patient           Patient will benefit from skilled  therapeutic intervention in order to improve the following deficits and impairments:  Decreased activity tolerance, Decreased endurance, Decreased range of motion, Decreased mobility, Decreased strength, Hypomobility, Pain  Visit Diagnosis: Pain in right hip  Joint stiffness  Muscle weakness (generalized)     Problem List There are no problems to display for this patient.   Carlyle Basques, SPT 12/18/2019, 9:07 AM  [facility] [address] [place], [place], [address] Phone: [phone]   Fax:  [phone]  Name: Robert Ray MRN: [id number] Date of Birth: 17-Jul-1950

## 2019-12-23 ENCOUNTER — Other Ambulatory Visit: Payer: Self-pay

## 2019-12-23 ENCOUNTER — Ambulatory Visit: Payer: Medicare Other | Admitting: Physical Therapy

## 2019-12-23 ENCOUNTER — Encounter: Payer: Self-pay | Admitting: Physical Therapy

## 2019-12-23 DIAGNOSIS — M25551 Pain in right hip: Secondary | ICD-10-CM

## 2019-12-23 DIAGNOSIS — M2560 Stiffness of unspecified joint, not elsewhere classified: Secondary | ICD-10-CM

## 2019-12-23 DIAGNOSIS — M6281 Muscle weakness (generalized): Secondary | ICD-10-CM

## 2019-12-23 NOTE — Therapy (Signed)
[place] [facility] [address] [place], [place], [address] Phone: [phone]   Fax:  [phone]  Physical Therapy Treatment  Patient Details  Name: Robert Ray MRN: [id number] Date of Birth: October 18, 1950 Referring Provider (PT): Caleen Essex, MD   Encounter Date: 12/23/2019   PT End of Session - 12/23/19 0726    Visit Number 5    Number of Visits 9    Date for PT Re-Evaluation 01/06/20    Authorization - Visit Number 5    Authorization - Number of Visits 10    PT Start Time 0720    PT Stop Time 0808    PT Time Calculation (min) 48 min    Activity Tolerance Patient tolerated treatment well    Behavior During Therapy [facility] for tasks assessed/performed           Past Medical History:  Diagnosis Date  . Coronary artery disease   . Erectile disorder due to medical condition in male patient   . GERD (gastroesophageal reflux disease)   . Hypertension   . Hypothyroidism   . Myocardial infarction [facility])     Past Surgical History:  Procedure Laterality Date  . COLONOSCOPY WITH PROPOFOL N/A 05/08/2015   Procedure: COLONOSCOPY WITH PROPOFOL;  Surgeon: Manya Silvas, MD;  Location: [facility] ENDOSCOPY;  Service: Endoscopy;  Laterality: N/A;  . COLONOSCOPY WITH PROPOFOL N/A 10/10/2018   Procedure: COLONOSCOPY WITH PROPOFOL;  Surgeon: Toledo, Benay Pike, MD;  Location: [facility] ENDOSCOPY;  Service: Gastroenterology;  Laterality: N/A;  . CORONARY ANGIOPLASTY    . ESOPHAGOGASTRODUODENOSCOPY (EGD) WITH PROPOFOL N/A 04/07/2017   Procedure: ESOPHAGOGASTRODUODENOSCOPY (EGD) WITH PROPOFOL;  Surgeon: Manya Silvas, MD;  Location: [facility] ENDOSCOPY;  Service: Endoscopy;  Laterality: N/A;  . ESOPHAGOGASTRODUODENOSCOPY (EGD) WITH PROPOFOL N/A 10/10/2018   Procedure: ESOPHAGOGASTRODUODENOSCOPY (EGD) WITH PROPOFOL;  Surgeon: Toledo, Benay Pike, MD;  Location: [facility] ENDOSCOPY;  Service: Gastroenterology;  Laterality: N/A;  . HEMORRHOIDECTOMY WITH  HEMORRHOID BANDING      There were no vitals filed for this visit.   Subjective Assessment - 12/23/19 0724    Subjective Pt. reports that he has not been compliant with his HEP. He states PT is helping, however he feels that it is currently short term help.    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 No/denies           There Ex:  Nustep L3 10 mins (unbilled)  Supine Bridges: heavy education provided for TrA activation prior to beginning session. Pt able to maintain TrA and breathing completing 2x10 bridges. Progressed to bridge marches 2x10 each leg. Cueing for sequencing of actions and maintaining glute contraction to decrease pelvic drop on contralateral side during march  Supine posterior pelvic tilt: 10x. Mild verbal and tactile cueing to improve tilt and maintain TrA contraction.   Standing R hip ER: 10x. Progressed to 3lb ankle weight on R, 10x. Cueing to decrease trunk rotation throughout.  Standing R bent knee IR: pt able to achieve to neutral IR, 5x with 3lb weight  Squats: 2x10. Mild-mod cueing for improving hip hinge and keeping even weight on both feet. Pt demonstrates tendency to shift to LLE to complete squat.    Manual:   Long axis distraction R LE: 3x30s   Lateral R hip glide with mobilization belt in supine: 3x30s, grade 4. PT provided passive ER  and IR to hip throughout mobilizations.          PT Education - 12/23/19 0807    Education Details HEP    Person(s) Educated Patient    Methods Explanation;Tactile cues;Demonstration;Verbal cues;Handout    Comprehension Verbalized understanding;Returned demonstration               PT Long Term Goals - 12/09/19 1144      PT LONG TERM GOAL #1   Title Pt. will increase FOTO score to 61 to improve pain free functional mobility.    Baseline 10/25: 31    Time 4    Period  Weeks    Status New    Target Date 01/06/20      PT LONG TERM GOAL #2   Title Pt. will improve R hip abductor strength to at least 4+/5 to improve functional mobility.    Baseline 10/25: sidelying R hip abd: 4/5    Time 4    Period Weeks    Status New    Target Date 01/06/20      PT LONG TERM GOAL #3   Title Pt. will report being able to sleep on R side for more than 10 mins without pain to improve pain free mobility.    Baseline 10/25: pt reports pain in right hip after sidelying on R side for 10 mins    Time 4    Period Weeks    Status New    Target Date 01/06/20      PT LONG TERM GOAL #4   Title Pt. will demonstrate ability to complete pain free squats to improve work realted functional mobility.    Baseline 10/25: pt demonstrates increased pain with squats    Time 4    Period Weeks    Status New    Target Date 01/06/20                 Plan - 12/23/19 7673    Clinical Impression Statement Pt. completed glute strengthening activities today. Pt. responded well to supine bridges and squats, as well as improving standing R hip ER. Pt. demonstrated good carryover with evenly weighting R LE during squats. PT and pt had discussion on continuing to complete deeper hip AROM motions throughout his day to maintain gains made with manual therapy on R hip. Pt. states that sleeping is still uncomfortable. Pt. will continue to benefit from skilled PT to improve work related tasks without pain.    Personal Factors and Comorbidities Fitness;Profession    Examination-Activity Limitations Bed Mobility;Squat;Stairs;Sleep    Examination-Participation Restrictions Community Activity;Occupation    Stability/Clinical Decision Making Stable/Uncomplicated    Clinical Decision Making Low    Rehab Potential Good    PT Frequency 2x / week    PT Duration 4 weeks    PT Treatment/Interventions ADLs/Self Care Home Management;Cryotherapy;Iontophoresis 4mg /ml Dexamethasone;Gait training;Stair  training;Functional mobility training;Therapeutic activities;Therapeutic exercise;Balance training;Neuromuscular re-education;Patient/family education;Manual techniques    PT Next Visit Plan R hip manual therapy for hip OA. Load glut med. Progress HEP.    PT Home Exercise Plan [phone]    Consulted and Agree with Plan of Care Patient           Patient will benefit from skilled therapeutic intervention in order to improve the following deficits and impairments:  Decreased activity tolerance, Decreased endurance, Decreased range of motion, Decreased mobility, Decreased strength, Hypomobility, Pain  Visit Diagnosis: Joint stiffness  Pain in right hip  Muscle weakness (generalized)     Problem  List There are no problems to display for this patient.  Pura Spice, PT, DPT # [id number] Carlyle Basques, SPT 12/23/2019, 8:13 AM  [place] [facility] [address] [place], [place], [address] Phone: [phone]   Fax:  [phone]  Name: CHASETON YEPIZ MRN: [id number] Date of Birth: 1950-12-01

## 2019-12-23 NOTE — Patient Instructions (Signed)
Access Code: [email or web address] URL: [email or web address] Date: 12/23/2019 Prepared by: Dorcas Carrow  Exercises Supine Bridge - 1 x daily - 7 x weekly - 3 sets - 10 reps Marching Bridge - 1 x daily - 7 x weekly - 3 sets - 10 reps Squat - 1 x daily - 7 x weekly - 3 sets - 10 reps

## 2019-12-25 ENCOUNTER — Other Ambulatory Visit: Payer: Self-pay

## 2019-12-25 ENCOUNTER — Ambulatory Visit: Payer: Medicare Other | Admitting: Physical Therapy

## 2019-12-25 ENCOUNTER — Encounter: Payer: Self-pay | Admitting: Physical Therapy

## 2019-12-25 DIAGNOSIS — M2560 Stiffness of unspecified joint, not elsewhere classified: Secondary | ICD-10-CM

## 2019-12-25 DIAGNOSIS — M25551 Pain in right hip: Secondary | ICD-10-CM

## 2019-12-25 DIAGNOSIS — M6281 Muscle weakness (generalized): Secondary | ICD-10-CM

## 2019-12-25 NOTE — Therapy (Signed)
[place] [facility] [address] [place], [place], [address] Phone: [phone]   Fax:  [phone]  Physical Therapy Treatment  Patient Details  Name: Robert Ray MRN: [id number] Date of Birth: 06-14-1950 Referring Provider (PT): Caleen Essex, MD   Encounter Date: 12/25/2019   PT End of Session - 12/25/19 0725    Visit Number 6    Number of Visits 9    Date for PT Re-Evaluation 01/06/20    Authorization - Visit Number 6    Authorization - Number of Visits 10    PT Start Time 0722    PT Stop Time 0810    PT Time Calculation (min) 48 min    Activity Tolerance Patient tolerated treatment well    Behavior During Therapy [facility] for tasks assessed/performed           Past Medical History:  Diagnosis Date  . Coronary artery disease   . Erectile disorder due to medical condition in male patient   . GERD (gastroesophageal reflux disease)   . Hypertension   . Hypothyroidism   . Myocardial infarction [facility])     Past Surgical History:  Procedure Laterality Date  . COLONOSCOPY WITH PROPOFOL N/A 05/08/2015   Procedure: COLONOSCOPY WITH PROPOFOL;  Surgeon: Manya Silvas, MD;  Location: [facility] ENDOSCOPY;  Service: Endoscopy;  Laterality: N/A;  . COLONOSCOPY WITH PROPOFOL N/A 10/10/2018   Procedure: COLONOSCOPY WITH PROPOFOL;  Surgeon: Toledo, Benay Pike, MD;  Location: [facility] ENDOSCOPY;  Service: Gastroenterology;  Laterality: N/A;  . CORONARY ANGIOPLASTY    . ESOPHAGOGASTRODUODENOSCOPY (EGD) WITH PROPOFOL N/A 04/07/2017   Procedure: ESOPHAGOGASTRODUODENOSCOPY (EGD) WITH PROPOFOL;  Surgeon: Manya Silvas, MD;  Location: [facility] ENDOSCOPY;  Service: Endoscopy;  Laterality: N/A;  . ESOPHAGOGASTRODUODENOSCOPY (EGD) WITH PROPOFOL N/A 10/10/2018   Procedure: ESOPHAGOGASTRODUODENOSCOPY (EGD) WITH PROPOFOL;  Surgeon: Toledo, Benay Pike, MD;  Location: [facility] ENDOSCOPY;  Service: Gastroenterology;  Laterality: N/A;  . HEMORRHOIDECTOMY WITH  HEMORRHOID BANDING      There were no vitals filed for this visit.   Subjective Assessment - 12/25/19 0724    Subjective Pt reports no pain this a.m. Difficulty sleeping on his sides due to hip pain.    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 No/denies          There.ex:   Sci-Fit L 5.5 LE's only for improving LE strength and reducing stiffness. 8 min.   R side-lying hip abduction: 2x15 reps with 5 lbs AW's at ankle. 1x15 reps with 5 lbs AW at distal femur due to fatigue and bad form/technique.   R side-lying clam shells for R hip ER's: Green TB 3x15. Good form/technique   B 12" single leg step down: BUE support. Mod verbal cues and tactile cues for redcuing R knee valgus. 2x15 reps.  Resisted side steps with Green TB at distal femurs: x6. x2 with band at ankles. Min verbal cues for form and maintaining wide BOS to keep resistance on green TB     Manual Therapy:   B hamstring and glut max stretch: 2x30 sec to improve LE flexibility   R hip PA grade 3 mobs with hip flexed to 90 deg to improve R hip joint mobility: 3x30 sec bouts   R hip long arc distraction: 2x30 sec to improve joint mobility and reduce joint stiffness  R hip lateral glide  with mob belt: 2x30 sec for improved hip mobility and reduced stiffness    PT Long Term Goals - 12/09/19 1144      PT LONG TERM GOAL #1   Title Pt. will increase FOTO score to 61 to improve pain free functional mobility.    Baseline 10/25: 31    Time 4    Period Weeks    Status New    Target Date 01/06/20      PT LONG TERM GOAL #2   Title Pt. will improve R hip abductor strength to at least 4+/5 to improve functional mobility.    Baseline 10/25: sidelying R hip abd: 4/5    Time 4    Period Weeks    Status New    Target Date 01/06/20      PT LONG TERM GOAL #3   Title Pt. will report being able to sleep on R  side for more than 10 mins without pain to improve pain free mobility.    Baseline 10/25: pt reports pain in right hip after sidelying on R side for 10 mins    Time 4    Period Weeks    Status New    Target Date 01/06/20      PT LONG TERM GOAL #4   Title Pt. will demonstrate ability to complete pain free squats to improve work realted functional mobility.    Baseline 10/25: pt demonstrates increased pain with squats    Time 4    Period Weeks    Status New    Target Date 01/06/20                 Plan - 12/25/19 0811    Clinical Impression Statement Additional deep glut external rotators added to HEP and today's session. Fair carryover with 12" step down with min tactile cues for RLE to reduce R knee valgus. Progressed resisted side steps and side-lying hip abduction with green TB. No increased pain after session. Pt can continue to benefit from skilled PT to improve functional mobility.    Personal Factors and Comorbidities Fitness;Profession    Examination-Activity Limitations Bed Mobility;Squat;Stairs;Sleep    Examination-Participation Restrictions Community Activity;Occupation    Stability/Clinical Decision Making Stable/Uncomplicated    Clinical Decision Making Low    Rehab Potential Good    PT Frequency 2x / week    PT Duration 4 weeks    PT Treatment/Interventions ADLs/Self Care Home Management;Cryotherapy;Iontophoresis 4mg /ml Dexamethasone;Gait training;Stair training;Functional mobility training;Therapeutic activities;Therapeutic exercise;Balance training;Neuromuscular re-education;Patient/family education;Manual techniques    PT Next Visit Plan Resisted hip ER and abduction. Total gym single leg squats.    PT Home Exercise Plan [phone]    Consulted and Agree with Plan of Care Patient           Patient will benefit from skilled therapeutic intervention in order to improve the following deficits and impairments:  Decreased activity tolerance, Decreased endurance,  Decreased range of motion, Decreased mobility, Decreased strength, Hypomobility, Pain  Visit Diagnosis: Joint stiffness  Pain in right hip  Muscle weakness (generalized)     Problem List There are no problems to display for this patient.  Pura Spice, PT, DPT # [id number] Larna Daughters, SPT 12/25/2019, 9:33 AM  [place] [facility] [address] [place], [place], [address] Phone: [phone]   Fax:  [phone]  Name: Robert Ray MRN: [id number] Date of Birth: 1950/05/02

## 2019-12-30 ENCOUNTER — Encounter: Payer: Self-pay | Admitting: Physical Therapy

## 2019-12-30 ENCOUNTER — Ambulatory Visit: Payer: Medicare Other | Admitting: Physical Therapy

## 2019-12-30 ENCOUNTER — Other Ambulatory Visit: Payer: Self-pay

## 2019-12-30 DIAGNOSIS — M25551 Pain in right hip: Secondary | ICD-10-CM | POA: Diagnosis not present

## 2019-12-30 DIAGNOSIS — M2560 Stiffness of unspecified joint, not elsewhere classified: Secondary | ICD-10-CM

## 2019-12-30 DIAGNOSIS — M6281 Muscle weakness (generalized): Secondary | ICD-10-CM

## 2019-12-30 NOTE — Therapy (Signed)
[facility] [address] [place], [place], [address] Phone: [phone]   Fax:  [phone]  Physical Therapy Treatment  Patient Details  Name: Robert Ray MRN: [id number] Date of Birth: 03-09-1950 Referring Provider (PT): Caleen Essex, MD   Encounter Date: 12/30/2019   PT End of Session - 12/30/19 0727    Visit Number 7    Number of Visits 9    Date for PT Re-Evaluation 01/06/20    Authorization - Visit Number 7    Authorization - Number of Visits 10    PT Start Time 0722    PT Stop Time 0810    PT Time Calculation (min) 48 min    Activity Tolerance Patient tolerated treatment well;No increased pain    Behavior During Therapy WFL for tasks assessed/performed           Past Medical History:  Diagnosis Date  . Coronary artery disease   . Erectile disorder due to medical condition in male patient   . GERD (gastroesophageal reflux disease)   . Hypertension   . Hypothyroidism   . Myocardial infarction [facility])     Past Surgical History:  Procedure Laterality Date  . COLONOSCOPY WITH PROPOFOL N/A 05/08/2015   Procedure: COLONOSCOPY WITH PROPOFOL;  Surgeon: Manya Silvas, MD;  Location: [facility] ENDOSCOPY;  Service: Endoscopy;  Laterality: N/A;  . COLONOSCOPY WITH PROPOFOL N/A 10/10/2018   Procedure: COLONOSCOPY WITH PROPOFOL;  Surgeon: Toledo, Benay Pike, MD;  Location: [facility] ENDOSCOPY;  Service: Gastroenterology;  Laterality: N/A;  . CORONARY ANGIOPLASTY    . ESOPHAGOGASTRODUODENOSCOPY (EGD) WITH PROPOFOL N/A 04/07/2017   Procedure: ESOPHAGOGASTRODUODENOSCOPY (EGD) WITH PROPOFOL;  Surgeon: Manya Silvas, MD;  Location: [facility] ENDOSCOPY;  Service: Endoscopy;  Laterality: N/A;  . ESOPHAGOGASTRODUODENOSCOPY (EGD) WITH PROPOFOL N/A 10/10/2018   Procedure: ESOPHAGOGASTRODUODENOSCOPY (EGD) WITH PROPOFOL;  Surgeon: Toledo, Benay Pike, MD;  Location: [facility] ENDOSCOPY;  Service: Gastroenterology;  Laterality: N/A;  .  HEMORRHOIDECTOMY WITH HEMORRHOID BANDING      There were no vitals filed for this visit.   Subjective Assessment - 12/30/19 0725    Subjective Pt reports R Hip is feeling better but "tweaked" his L lower back. No R hip pain.    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 Yes    Pain Score 3     Pain Location Back    Pain Orientation Left;Lower    Pain Descriptors / Indicators Aching;Discomfort    Pain Onset More than a month ago    Pain Frequency Intermittent            There.ex:   Nu-Step L5 for 10 min. LE's only.   Seated lumbar flexion with blue exercise ball due to new onset of LBP: L/R and forward alternating. 2x10 reps.  Supine lower trunk rotations due to new onset of LBP: 2x10  Total Gym squats: 1x20. Initial Min verbal cuing  Total Gym single leg squats: verbal and tactile cueing to maintain proper knee positioning. 10x each leg  Alternating lunges in // bars with BUE support. Use of mirrors for form. Mod verbal and tactile cues for form/technique: x6  Resisted side steps with blue TB around distal femur along floor ladder: 2x8  Resisted backwards walking at [employer]: 95 lbs, x8. SBA+1    PT Education - 12/30/19 0727    Education Details Form/tehcnique with  exercise.    Person(s) Educated Patient    Methods Explanation;Demonstration;Tactile cues    Comprehension Verbalized understanding;Returned demonstration               PT Long Term Goals - 12/09/19 1144      PT LONG TERM GOAL #1   Title Pt. will increase FOTO score to 61 to improve pain free functional mobility.    Baseline 10/25: 31    Time 4    Period Weeks    Status New    Target Date 01/06/20      PT LONG TERM GOAL #2   Title Pt. will improve R hip abductor strength to at least 4+/5 to improve functional mobility.    Baseline 10/25: sidelying R hip abd: 4/5    Time 4     Period Weeks    Status New    Target Date 01/06/20      PT LONG TERM GOAL #3   Title Pt. will report being able to sleep on R side for more than 10 mins without pain to improve pain free mobility.    Baseline 10/25: pt reports pain in right hip after sidelying on R side for 10 mins    Time 4    Period Weeks    Status New    Target Date 01/06/20      PT LONG TERM GOAL #4   Title Pt. will demonstrate ability to complete pain free squats to improve work realted functional mobility.    Baseline 10/25: pt demonstrates increased pain with squats    Time 4    Period Weeks    Status New    Target Date 01/06/20                 Plan - 12/30/19 0809    Clinical Impression Statement Improvements noted in form/technique with resisted side walking. Progressed to lunges and single leg squats in total gym for improved single LE stength and glut med stability requiring mod verbal and tactile cues for keeping knee in line with toes. Pt can continue to benefit from skilled PT treatmen to improve functional mobility.    Personal Factors and Comorbidities Fitness;Profession    Examination-Activity Limitations Bed Mobility;Squat;Stairs;Sleep    Examination-Participation Restrictions Community Activity;Occupation    Stability/Clinical Decision Making Stable/Uncomplicated    Clinical Decision Making Low    Rehab Potential Good    PT Frequency 2x / week    PT Duration 4 weeks    PT Treatment/Interventions ADLs/Self Care Home Management;Cryotherapy;Iontophoresis 4mg /ml Dexamethasone;Gait training;Stair training;Functional mobility training;Therapeutic activities;Therapeutic exercise;Balance training;Neuromuscular re-education;Patient/family education;Manual techniques    PT Next Visit Plan Total gym single leg squats and reassess lunges in // bars.    PT Home Exercise Plan [phone]    Consulted and Agree with Plan of Care Patient           Patient will benefit from skilled therapeutic  intervention in order to improve the following deficits and impairments:  Decreased activity tolerance, Decreased endurance, Decreased range of motion, Decreased mobility, Decreased strength, Hypomobility, Pain  Visit Diagnosis: Joint stiffness  Pain in right hip  Muscle weakness (generalized)     Problem List There are no problems to display for this patient.  Pura Spice, PT, DPT # [id number] Larna Daughters, SPT 12/30/2019, 10:16 AM  [place] [facility] [address] [place], [place], [address] Phone: [phone]   Fax:  [phone]  Name: JABIER DEESE MRN: [id number] Date  of Birth: [birth year]

## 2020-01-01 ENCOUNTER — Ambulatory Visit: Payer: Medicare Other | Admitting: Physical Therapy

## 2020-01-06 ENCOUNTER — Encounter: Payer: Self-pay | Admitting: Physical Therapy

## 2020-01-06 ENCOUNTER — Ambulatory Visit: Payer: Medicare Other | Admitting: Physical Therapy

## 2020-01-06 ENCOUNTER — Other Ambulatory Visit: Payer: Self-pay

## 2020-01-06 DIAGNOSIS — M2560 Stiffness of unspecified joint, not elsewhere classified: Secondary | ICD-10-CM

## 2020-01-06 DIAGNOSIS — M6281 Muscle weakness (generalized): Secondary | ICD-10-CM

## 2020-01-06 DIAGNOSIS — M25551 Pain in right hip: Secondary | ICD-10-CM

## 2020-01-06 NOTE — Therapy (Signed)
[facility] [address] [place], [place], [address] Phone: [phone]   Fax:  [phone]  Physical Therapy Treatment  Patient Details  Name: Robert Ray MRN: [id number] Date of Birth: [birth year] Referring Provider (PT): Caleen Essex, MD   Encounter Date: 01/06/2020   PT End of Session - 01/06/20 0811    Visit Number 8    Number of Visits 9    Date for PT Re-Evaluation 01/06/20    Authorization - Visit Number 8    Authorization - Number of Visits 10    PT Start Time 0803    PT Stop Time 0901    PT Time Calculation (min) 58 min    Activity Tolerance Patient tolerated treatment well;No increased pain    Behavior During Therapy WFL for tasks assessed/performed           Past Medical History:  Diagnosis Date  . Coronary artery disease   . Erectile disorder due to medical condition in male patient   . GERD (gastroesophageal reflux disease)   . Hypertension   . Hypothyroidism   . Myocardial infarction [facility]     Past Surgical History:  Procedure Laterality Date  . COLONOSCOPY WITH PROPOFOL N/A 05/08/2015   Procedure: COLONOSCOPY WITH PROPOFOL;  Surgeon: Manya Silvas, MD;  Location: [facility] ENDOSCOPY;  Service: Endoscopy;  Laterality: N/A;  . COLONOSCOPY WITH PROPOFOL N/A 10/10/2018   Procedure: COLONOSCOPY WITH PROPOFOL;  Surgeon: Toledo, Benay Pike, MD;  Location: [facility] ENDOSCOPY;  Service: Gastroenterology;  Laterality: N/A;  . CORONARY ANGIOPLASTY    . ESOPHAGOGASTRODUODENOSCOPY (EGD) WITH PROPOFOL N/A 04/07/2017   Procedure: ESOPHAGOGASTRODUODENOSCOPY (EGD) WITH PROPOFOL;  Surgeon: Manya Silvas, MD;  Location: [facility] ENDOSCOPY;  Service: Endoscopy;  Laterality: N/A;  . ESOPHAGOGASTRODUODENOSCOPY (EGD) WITH PROPOFOL N/A 10/10/2018   Procedure: ESOPHAGOGASTRODUODENOSCOPY (EGD) WITH PROPOFOL;  Surgeon: Toledo, Benay Pike, MD;  Location: [facility] ENDOSCOPY;  Service: Gastroenterology;  Laterality: N/A;  .  HEMORRHOIDECTOMY WITH HEMORRHOID BANDING      There were no vitals filed for this visit.   Subjective Assessment - 01/06/20 0806    Subjective Pt. reports no pain at rest and 5-6/10 L low back/ R hip discomfort with forward flexed/ repetitive tasks/ ackward movements.    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 No/denies    Pain Onset More than a month ago            There.ex:   Nu-Step L5 for 12 min. LE's only.  No c/o hip or back pain.               Walking in clinic with gait/ posture assessment.  Standing lumbar flexion/ extension/ rotn./ lateral flexion 3x each.  Generalized tightness noted.   Supine hamstring/ piriformis/ hip rotation/ lower trunk rotations stretches 5x each with static holds as tolerated  Total Gym squats: 1x20. Initial Min verbal cuing Total Gym single leg squats: verbal and tactile cueing to maintain proper knee positioning. 10x each leg  Nautilus: resisted lateral/forward gait with 80# 6x each.   Supine BTB marching/ hip abduction 20x.  Supine pelvic tilts (limited)  Seated STM to lumbar musculature/ use of Hypervolt    PT Education - 01/06/20 0919    Education Details Discussed importance of consistent lumbar/hip stretches (supine hamstring/ trunk rotn./ piriformis stretches).  Reviewed HEP    Person(s) Educated  Patient    Methods Explanation;Demonstration    Comprehension Verbalized understanding;Returned demonstration               PT Long Term Goals - 12/09/19 1144      PT LONG TERM GOAL #1   Title Pt. will increase FOTO score to 61 to improve pain free functional mobility.    Baseline 10/25: 31    Time 4    Period Weeks    Status New    Target Date 01/06/20      PT LONG TERM GOAL #2   Title Pt. will improve R hip abductor strength to at least 4+/5 to improve functional mobility.    Baseline 10/25:  sidelying R hip abd: 4/5    Time 4    Period Weeks    Status New    Target Date 01/06/20      PT LONG TERM GOAL #3   Title Pt. will report being able to sleep on R side for more than 10 mins without pain to improve pain free mobility.    Baseline 10/25: pt reports pain in right hip after sidelying on R side for 10 mins    Time 4    Period Weeks    Status New    Target Date 01/06/20      PT LONG TERM GOAL #4   Title Pt. will demonstrate ability to complete pain free squats to improve work realted functional mobility.    Baseline 10/25: pt demonstrates increased pain with squats    Time 4    Period Weeks    Status New    Target Date 01/06/20              Plan - 01/06/20 0811    Clinical Impression Statement PT tx. focus on B hip/lumbar stretches in supine position.  Moderate B lumbar paraspinal muscle tightness noted in seated posture during soft tissue assessment/ Hypervolt.  Limited hip internal rotation bilaterally.  No increase c/o hip pain during tx. session and good technique noted with resisted LE ex.  PT will reassess all goals next tx. session and discuss POC.    Personal Factors and Comorbidities Fitness;Profession    Examination-Activity Limitations Bed Mobility;Squat;Stairs;Sleep    Examination-Participation Restrictions Community Activity;Occupation    Stability/Clinical Decision Making Stable/Uncomplicated    Clinical Decision Making Low    Rehab Potential Good    PT Frequency 2x / week    PT Duration 4 weeks    PT Treatment/Interventions ADLs/Self Care Home Management;Cryotherapy;Iontophoresis 4mg /ml Dexamethasone;Gait training;Stair training;Functional mobility training;Therapeutic activities;Therapeutic exercise;Balance training;Neuromuscular re-education;Patient/family education;Manual techniques    PT Next Visit Plan Reassess goals next tx. session.    PT Home Exercise Plan [id number]    Consulted and Agree with Plan of Care Patient           Patient will  benefit from skilled therapeutic intervention in order to improve the following deficits and impairments:  Decreased activity tolerance, Decreased endurance, Decreased range of motion, Decreased mobility, Decreased strength, Hypomobility, Pain  Visit Diagnosis: Joint stiffness  Pain in right hip  Muscle weakness (generalized)     Problem List There are no problems to display for this patient.  Pura Spice, PT, DPT # [id number] 01/06/2020, 9:24 AM  [facility] [address] [place], [place], [address] Phone: [phone]   Fax:  [phone]  Name: Robert Ray MRN: [id number] Date of Birth: 10-21-1950

## 2020-01-08 ENCOUNTER — Ambulatory Visit: Payer: Medicare Other | Admitting: Physical Therapy

## 2020-01-08 ENCOUNTER — Other Ambulatory Visit: Payer: Self-pay

## 2020-01-08 DIAGNOSIS — M25551 Pain in right hip: Secondary | ICD-10-CM

## 2020-01-08 DIAGNOSIS — M6281 Muscle weakness (generalized): Secondary | ICD-10-CM

## 2020-01-08 DIAGNOSIS — M2560 Stiffness of unspecified joint, not elsewhere classified: Secondary | ICD-10-CM

## 2020-01-10 NOTE — Therapy (Addendum)
[facility] [address] [place], [place], [address] Phone: [phone]   Fax:  [phone]  Physical Therapy Treatment  Patient Details  Name: Robert Ray MRN: [id number] Date of Birth: 02/18/1950 Referring Provider (PT): Marshall Wilson Anderson, MD   Encounter Date: 01/08/2020     PT End of Session - 01/10/20 0931    Visit Number 9    Number of Visits 17    Date for PT Re-Evaluation 02/05/20    Authorization - Visit Number 1    Authorization - Number of Visits 10    PT Start Time 0804    PT Stop Time 0858    PT Time Calculation (min) 54 min    Activity Tolerance Patient tolerated treatment well;No increased pain    Behavior During Therapy WFL for tasks assessed/performed          Past Medical History:  Diagnosis Date  . Coronary artery disease   . Erectile disorder due to medical condition in male patient   . GERD (gastroesophageal reflux disease)   . Hypertension   . Hypothyroidism   . Myocardial infarction (HCC)     Past Surgical History:  Procedure Laterality Date  . COLONOSCOPY WITH PROPOFOL N/A 05/08/2015   Procedure: COLONOSCOPY WITH PROPOFOL;  Surgeon: Robert T Elliott, MD;  Location: [facility] ENDOSCOPY;  Service: Endoscopy;  Laterality: N/A;  . COLONOSCOPY WITH PROPOFOL N/A 10/10/2018   Procedure: COLONOSCOPY WITH PROPOFOL;  Surgeon: Toledo, Teodoro K, MD;  Location: [facility] ENDOSCOPY;  Service: Gastroenterology;  Laterality: N/A;  . CORONARY ANGIOPLASTY    . ESOPHAGOGASTRODUODENOSCOPY (EGD) WITH PROPOFOL N/A 04/07/2017   Procedure: ESOPHAGOGASTRODUODENOSCOPY (EGD) WITH PROPOFOL;  Surgeon: Elliott, Robert T, MD;  Location: [facility] ENDOSCOPY;  Service: Endoscopy;  Laterality: N/A;  . ESOPHAGOGASTRODUODENOSCOPY (EGD) WITH PROPOFOL N/A 10/10/2018   Procedure: ESOPHAGOGASTRODUODENOSCOPY (EGD) WITH PROPOFOL;  Surgeon: Toledo, Teodoro K, MD;  Location: [facility] ENDOSCOPY;  Service: Gastroenterology;  Laterality: N/A;  .  HEMORRHOIDECTOMY WITH HEMORRHOID BANDING      There were no vitals filed for this visit.   Subjective Assessment - 01/13/20 0933    Subjective Pt. currently reports no pain at rest but occasional sharp L hip pain with certain position changes (esp. IR).    Pertinent History contract worker    Limitations Walking;House hold activities;Lifting    Patient Stated Goals wants to return to prior function    Currently in Pain? No/denies    Pain Onset More than a month ago              There.ex:   Scifit L6 for 10 min. LE's only.  No c/o hip or back pain.   Walking in clinic with gait/ posture assessment.  Standing lumbar flexion/ extension/ rotn./ lateral flexion 3x each.  Generalized tightness noted.   Total Gym squats: 1x20. Initial Min verbal cuing Total Gym single leg squats: verbal and tactile cueing to maintain proper knee positioning. 10x each leg  Nautilus:resisted lateral/forward gait with 95# 6x each.   Supine BTB marching/ hip abduction 20x.  Supine pelvic tilts (limited)   Manual tx.:  Supine hamstring/ piriformis/ hip rotation/ lower trunk rotations stretches 5x each with static holds as tolerated  Seated STM to lumbar musculature/ use of Hypervolt      PT Long Term Goals - 01/13/20 0942      PT LONG TERM GOAL #1   Title Pt. will increase FOTO score to 61 to improve pain free functional mobility.      Baseline 10/25: 31    Time 4    Period Weeks    Status On-going    Target Date 02/05/20      PT LONG TERM GOAL #2   Title Pt. will improve R hip abductor strength to at least 4+/5 to improve functional mobility.    Baseline 10/25: sidelying R hip abd: 4/5    Time 4    Period Weeks    Status Partially Met    Target Date 02/05/20      PT LONG TERM GOAL #3   Title Pt. will report being able to sleep on R side for more than 10 mins without pain to improve pain free mobility.    Baseline 10/25: pt reports pain in right hip after sidelying  on R side for 10 mins    Time 4    Period Weeks    Status Partially Met    Target Date 02/05/20      PT LONG TERM GOAL #4   Title Pt. will demonstrate ability to complete pain free squats to improve work realted functional mobility.    Baseline 10/25: pt demonstrates increased pain with squats    Time 4    Period Weeks    Status Partially Met    Target Date 02/05/20              Plan - 01/13/20 0935    Clinical Impression Statement Pt. has shown progress with decrease R hip pain t/o the day with occasional pain reported during R hip IR.  Pt. has done well wtih manual tx./ lumbar and hip stretches with a progression of HEP.  Pt. remains limited with HEP compliance on a consistent basis and pt. understands the importance of R hip/lumbar stretching and strengthening.  R hip IR/ER remains limited with marked increase in hip flexion/ hamstring flexibility.  Pt. will benefit from continued PT to progress towards all goals.    Personal Factors and Comorbidities Fitness;Profession    Examination-Activity Limitations Bed Mobility;Squat;Stairs;Sleep    Examination-Participation Restrictions Community Activity;Occupation    Stability/Clinical Decision Making Stable/Uncomplicated    Clinical Decision Making Low    Rehab Potential Good    PT Frequency 2x / week    PT Duration 4 weeks    PT Treatment/Interventions ADLs/Self Care Home Management;Cryotherapy;Iontophoresis 55m/ml Dexamethasone;Gait training;Stair training;Functional mobility training;Therapeutic activities;Therapeutic exercise;Balance training;Neuromuscular re-education;Patient/family education;Manual techniques    PT Next Visit Plan R hip strengthening/ R hip mobs. and stretching.    PT Home Exercise Plan [phone]   Consulted and Agree with Plan of Care Patient           Patient will benefit from skilled therapeutic intervention in order to improve the following deficits and impairments:  Decreased activity tolerance,  Decreased endurance, Decreased range of motion, Decreased mobility, Decreased strength, Hypomobility, Pain  Visit Diagnosis: Joint stiffness  Pain in right hip  Muscle weakness (generalized)     Problem List There are no problems to display for this patient.  MPura Spice PT, DPT # [id number]/29/2021, 9:43 AM  [place] [facility]MHuet[address] [place] [address]Phone: [phone]  Fax:  [phone] Name: Robert BOWNSMRN: [id number] of Birth: [birth year]

## 2020-01-13 ENCOUNTER — Encounter: Payer: Self-pay | Admitting: Physical Therapy

## 2020-01-13 ENCOUNTER — Other Ambulatory Visit: Payer: Self-pay

## 2020-01-13 ENCOUNTER — Ambulatory Visit: Payer: Medicare Other | Admitting: Physical Therapy

## 2020-01-13 DIAGNOSIS — M25551 Pain in right hip: Secondary | ICD-10-CM | POA: Diagnosis not present

## 2020-01-13 DIAGNOSIS — M6281 Muscle weakness (generalized): Secondary | ICD-10-CM

## 2020-01-13 DIAGNOSIS — M2560 Stiffness of unspecified joint, not elsewhere classified: Secondary | ICD-10-CM

## 2020-01-13 NOTE — Therapy (Signed)
[facility] [address] [place], [place], [address] Phone: [phone]   Fax:  [phone]  Physical Therapy Treatment  Patient Details  Name: Robert Ray MRN: [id number] Date of Birth: 11/18/1950 Referring Provider (PT): Cathrine Muster, MD   Encounter Date: 01/13/2020   PT End of Session - 01/13/20 0953    Visit Number 10    Number of Visits 17    Date for PT Re-Evaluation 02/05/20    Authorization - Visit Number 2    Authorization - Number of Visits 10    PT Start Time 0725    PT Stop Time 0820    PT Time Calculation (min) 55 min    Activity Tolerance Patient tolerated treatment well;No increased pain    Behavior During Therapy WFL for tasks assessed/performed           Past Medical History:  Diagnosis Date  . Coronary artery disease   . Erectile disorder due to medical condition in male patient   . GERD (gastroesophageal reflux disease)   . Hypertension   . Hypothyroidism   . Myocardial infarction [facility])     Past Surgical History:  Procedure Laterality Date  . COLONOSCOPY WITH PROPOFOL N/A 05/08/2015   Procedure: COLONOSCOPY WITH PROPOFOL;  Surgeon: Scot Jun, MD;  Location: [facility] ENDOSCOPY;  Service: Endoscopy;  Laterality: N/A;  . COLONOSCOPY WITH PROPOFOL N/A 10/10/2018   Procedure: COLONOSCOPY WITH PROPOFOL;  Surgeon: Toledo, Boykin Nearing, MD;  Location: [facility] ENDOSCOPY;  Service: Gastroenterology;  Laterality: N/A;  . CORONARY ANGIOPLASTY    . ESOPHAGOGASTRODUODENOSCOPY (EGD) WITH PROPOFOL N/A 04/07/2017   Procedure: ESOPHAGOGASTRODUODENOSCOPY (EGD) WITH PROPOFOL;  Surgeon: Scot Jun, MD;  Location: [facility] ENDOSCOPY;  Service: Endoscopy;  Laterality: N/A;  . ESOPHAGOGASTRODUODENOSCOPY (EGD) WITH PROPOFOL N/A 10/10/2018   Procedure: ESOPHAGOGASTRODUODENOSCOPY (EGD) WITH PROPOFOL;  Surgeon: Toledo, Boykin Nearing, MD;  Location: [facility] ENDOSCOPY;  Service: Gastroenterology;  Laterality: N/A;  .  HEMORRHOIDECTOMY WITH HEMORRHOID BANDING      There were no vitals filed for this visit.   Subjective Assessment - 01/13/20 0951    Subjective Pt. states he is doing okay with no c/o hip/low back pain.  Pt. had a nice Thanksgiving weekend and had several days off of work.  Pt. walked into PT clinic with more normalized gait pattern and no signs of R antalgic gait pattern.    Pertinent History Hospital doctor    Limitations Walking;House hold activities;Lifting    Patient Stated Goals wants to return to prior function    Currently in Pain? No/denies    Pain Onset More than a month ago            There.ex:   Scifit L6 for . LE's only. No c/o hip or back pain.   Walking around PT gym with focus on consistent step pattern/ heel strike while maintaining upright posture.  Pt. Reports feeling better with walking after completing Scifit.   Supine BTB marching/ hip abduction 20x.  Supine bridging with added hip abduction BTB 10x.  Resisted gait 2BTB in //-bars with min. To no UE assist 10x all 4-planes.    Manual tx.:  Supinehamstring/ piriformis/ hip rotation/lower trunk rotations/ knee to cheststretches 3x each with static holds as tolerated  Seated STM to lumbar musculature/ use of Hypervolt     PT Long Term Goals - 01/13/20 0942      PT LONG TERM GOAL #1   Title Pt. will increase FOTO score to  61 to improve pain free functional mobility.    Baseline 10/25: 31    Time 4    Period Weeks    Status On-going    Target Date 02/05/20      PT LONG TERM GOAL #2   Title Pt. will improve R hip abductor strength to at least 4+/5 to improve functional mobility.    Baseline 10/25: sidelying R hip abd: 4/5    Time 4    Period Weeks    Status Partially Met    Target Date 02/05/20      PT LONG TERM GOAL #3   Title Pt. will report being able to sleep on R side for more than 10 mins without pain to improve pain free mobility.    Baseline 10/25: pt reports  pain in right hip after sidelying on R side for 10 mins    Time 4    Period Weeks    Status Partially Met    Target Date 02/05/20      PT LONG TERM GOAL #4   Title Pt. will demonstrate ability to complete pain free squats to improve work realted functional mobility.    Baseline 10/25: pt demonstrates increased pain with squats    Time 4    Period Weeks    Status Partially Met    Target Date 02/05/20                 Plan - 01/13/20 0954    Clinical Impression Statement Pt. ambulates with marked improvement in gait pattern while walking around clinic.  Good LE muscle endurance noted during Scifit with LE only (no UE assist).  R proximal hamstring muscle length tighter as compared to L hamstring in supine position.  No c/o R hip pain but moderate R hip joint limitations with IR.  PT discussed the importance of consistent stretching and strengthening ex.    Personal Factors and Comorbidities Fitness;Profession    Examination-Activity Limitations Bed Mobility;Squat;Stairs;Sleep    Examination-Participation Restrictions Community Activity;Occupation    Stability/Clinical Decision Making Stable/Uncomplicated    Clinical Decision Making Low    Rehab Potential Good    PT Frequency 2x / week    PT Duration 4 weeks    PT Treatment/Interventions ADLs/Self Care Home Management;Cryotherapy;Iontophoresis 86m/ml Dexamethasone;Gait training;Stair training;Functional mobility training;Therapeutic activities;Therapeutic exercise;Balance training;Neuromuscular re-education;Patient/family education;Manual techniques    PT Next Visit Plan R hip strengthening/ R hip mobs. and stretching.    PT Home Exercise Plan [phone]   Consulted and Agree with Plan of Care Patient           Patient will benefit from skilled therapeutic intervention in order to improve the following deficits and impairments:  Decreased activity tolerance, Decreased endurance, Decreased range of motion, Decreased mobility,  Decreased strength, Hypomobility, Pain  Visit Diagnosis: Joint stiffness  Pain in right hip  Muscle weakness (generalized)     Problem List There are no problems to display for this patient.  MPura Spice PT, DPT # [id number]/29/2021, 10:00 AM  [place] [facility]MLake [place] [address]Phone: [phone]  Fax:  [phone] Name: ADMANI MIZERMRN: [id number] of Birth: [birth year]

## 2020-01-13 NOTE — Addendum Note (Signed)
Addended by: Pura Spice on: 01/13/2020 09:48 AM   Modules accepted: Orders

## 2020-01-15 ENCOUNTER — Encounter: Payer: Self-pay | Admitting: Physical Therapy

## 2020-01-15 ENCOUNTER — Other Ambulatory Visit: Payer: Self-pay

## 2020-01-15 ENCOUNTER — Ambulatory Visit: Payer: Medicare Other | Attending: Internal Medicine | Admitting: Physical Therapy

## 2020-01-15 DIAGNOSIS — M25551 Pain in right hip: Secondary | ICD-10-CM | POA: Insufficient documentation

## 2020-01-15 DIAGNOSIS — M2560 Stiffness of unspecified joint, not elsewhere classified: Secondary | ICD-10-CM | POA: Diagnosis present

## 2020-01-15 DIAGNOSIS — M6281 Muscle weakness (generalized): Secondary | ICD-10-CM | POA: Diagnosis present

## 2020-01-15 NOTE — Therapy (Signed)
[facility] [address] [place], [place], [address] Phone: [phone]   Fax:  [phone]  Physical Therapy Treatment  Patient Details  Name: Robert Ray MRN: [id number] Date of Birth: 08/19/1950 Referring Provider (PT): Caleen Essex, MD   Encounter Date: 01/15/2020   PT End of Session - 01/15/20 0717    Visit Number 11    Number of Visits 17    Date for PT Re-Evaluation 02/05/20    Authorization - Visit Number 3    Authorization - Number of Visits 10    PT Start Time 0714    PT Stop Time 0805    PT Time Calculation (min) 51 min    Activity Tolerance Patient tolerated treatment well;No increased pain    Behavior During Therapy WFL for tasks assessed/performed           Past Medical History:  Diagnosis Date  . Coronary artery disease   . Erectile disorder due to medical condition in male patient   . GERD (gastroesophageal reflux disease)   . Hypertension   . Hypothyroidism   . Myocardial infarction [facility])     Past Surgical History:  Procedure Laterality Date  . COLONOSCOPY WITH PROPOFOL N/A 05/08/2015   Procedure: COLONOSCOPY WITH PROPOFOL;  Surgeon: Manya Silvas, MD;  Location: [facility] ENDOSCOPY;  Service: Endoscopy;  Laterality: N/A;  . COLONOSCOPY WITH PROPOFOL N/A 10/10/2018   Procedure: COLONOSCOPY WITH PROPOFOL;  Surgeon: Toledo, Benay Pike, MD;  Location: [facility] ENDOSCOPY;  Service: Gastroenterology;  Laterality: N/A;  . CORONARY ANGIOPLASTY    . ESOPHAGOGASTRODUODENOSCOPY (EGD) WITH PROPOFOL N/A 04/07/2017   Procedure: ESOPHAGOGASTRODUODENOSCOPY (EGD) WITH PROPOFOL;  Surgeon: Manya Silvas, MD;  Location: [facility] ENDOSCOPY;  Service: Endoscopy;  Laterality: N/A;  . ESOPHAGOGASTRODUODENOSCOPY (EGD) WITH PROPOFOL N/A 10/10/2018   Procedure: ESOPHAGOGASTRODUODENOSCOPY (EGD) WITH PROPOFOL;  Surgeon: Toledo, Benay Pike, MD;  Location: [facility] ENDOSCOPY;  Service: Gastroenterology;  Laterality: N/A;  .  HEMORRHOIDECTOMY WITH HEMORRHOID BANDING      There were no vitals filed for this visit.   Subjective Assessment - 01/15/20 0717    Subjective Pt. reports he is doing well.  Pt. feels ready for discharge from PT and understands that he has to be more compliant with a daily stretching/ HEP.    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 No/denies    Pain Onset More than a month ago              There.ex:   [id number] 13m. LE's only. Consistent cadence.  Slight discomfort with reverse directions.  TM walking at 2.4 mph on 0-5% incline with normalized gait pattern (no UE assist on handrails)  Walking around PT gym with focus on consistent step pattern/ heel strike while maintaining upright posture.    Reassessed lumbar AROM (flexion/ extension).    Supine BTB marching/ hip abduction 20x.  Supine bridging with added hip abduction BTB 10x.  TRX squats 20x with proper technique.    Manual tx.:  Supinehamstring/ piriformis/ hip rotation/lower trunk rotations/ knee to cheststretches 3x each with static holds as tolerated  L sidelying STM to lumbar and post. Hip musculature/ use of Hypervolt    PT Long Term Goals - 01/15/20 0948      PT LONG TERM GOAL #1   Title Pt. will increase FOTO score to 61 to improve pain  free functional mobility.    Baseline 10/25: 31.  12/1: 75 (goal met)    Time 4    Period Weeks    Status Achieved    Target Date 01/15/20      PT LONG TERM GOAL #2   Title Pt. will improve R hip abductor strength to at least 4+/5 to improve functional mobility.    Baseline 10/25: sidelying R hip abd: 4/5    Time 4    Period Weeks    Status Partially Met    Target Date 01/15/20      PT LONG TERM GOAL #3   Title Pt. will report being able to sleep on R side for more than 10 mins without pain to improve pain free mobility.     Baseline 10/25: pt reports pain in right hip after sidelying on R side for 10 mins    Time 4    Period Weeks    Status Partially Met    Target Date 01/15/20      PT LONG TERM GOAL #4   Title Pt. will demonstrate ability to complete pain free squats to improve work realted functional mobility.    Baseline 10/25: pt demonstrates increased pain with squats    Time 4    Period Weeks    Status Achieved    Target Date 01/15/20                 Plan - 01/15/20 0718    Clinical Impression Statement Pt. has shown marked improvement in overall functional mobility with decrease R hip pain/ discomfort.  Pt. remains limited with R hip IR ROM due to capsule joint stiffness/ discomfort.  Pt. able to ambulate on level and incline surfaces with no c/o R hip pain.  See updated PT goals.  FOTO (goal met).  Pt. instructed to contact PT if any regression of symptoms or questions.    Personal Factors and Comorbidities Fitness;Profession    Examination-Activity Limitations Bed Mobility;Squat;Stairs;Sleep    Examination-Participation Restrictions Community Activity;Occupation    Stability/Clinical Decision Making Stable/Uncomplicated    Clinical Decision Making Low    Rehab Potential Good    PT Frequency 2x / week    PT Duration 4 weeks    PT Treatment/Interventions ADLs/Self Care Home Management;Cryotherapy;Iontophoresis 9m/ml Dexamethasone;Gait training;Stair training;Functional mobility training;Therapeutic activities;Therapeutic exercise;Balance training;Neuromuscular re-education;Patient/family education;Manual techniques    PT Next Visit Plan Discharge    PT Home Exercise Plan [phone]   Consulted and Agree with Plan of Care Patient           Patient will benefit from skilled therapeutic intervention in order to improve the following deficits and impairments:  Decreased activity tolerance, Decreased endurance, Decreased range of motion, Decreased mobility, Decreased strength,  Hypomobility, Pain  Visit Diagnosis: Joint stiffness  Pain in right hip  Muscle weakness (generalized)     Problem List There are no problems to display for this patient.  MPura Spice PT, DPT # [id number]/02/2019, 9:51 AM  [place] [facility] [place] [address]Phone: [phone]  Fax:  [phone] Name: Robert LOGRASSOMRN: [id number] of Birth: [birth year]

## 2020-05-29 ENCOUNTER — Encounter: Payer: Self-pay | Admitting: Emergency Medicine

## 2020-05-29 ENCOUNTER — Ambulatory Visit
Admission: EM | Admit: 2020-05-29 | Discharge: 2020-05-29 | Disposition: A | Discharge status: Home / Self Care | Payer: Medicare Other | Attending: Physician Assistant | Admitting: Physician Assistant

## 2020-05-29 ENCOUNTER — Emergency Department
Admission: EM | Admit: 2020-05-29 | Discharge: 2020-05-29 | Disposition: A | Discharge status: Home / Self Care | Payer: Medicare Other | Attending: Emergency Medicine | Admitting: Emergency Medicine

## 2020-05-29 ENCOUNTER — Other Ambulatory Visit: Payer: Self-pay

## 2020-05-29 ENCOUNTER — Emergency Department: Payer: Medicare Other

## 2020-05-29 DIAGNOSIS — R42 Dizziness and giddiness: Secondary | ICD-10-CM | POA: Insufficient documentation

## 2020-05-29 DIAGNOSIS — R110 Nausea: Secondary | ICD-10-CM | POA: Insufficient documentation

## 2020-05-29 DIAGNOSIS — Z7982 Long term (current) use of aspirin: Secondary | ICD-10-CM | POA: Diagnosis not present

## 2020-05-29 DIAGNOSIS — Z7902 Long term (current) use of antithrombotics/antiplatelets: Secondary | ICD-10-CM | POA: Insufficient documentation

## 2020-05-29 DIAGNOSIS — I2510 Atherosclerotic heart disease of native coronary artery without angina pectoris: Secondary | ICD-10-CM | POA: Insufficient documentation

## 2020-05-29 DIAGNOSIS — Z87891 Personal history of nicotine dependence: Secondary | ICD-10-CM | POA: Insufficient documentation

## 2020-05-29 DIAGNOSIS — E039 Hypothyroidism, unspecified: Secondary | ICD-10-CM | POA: Insufficient documentation

## 2020-05-29 DIAGNOSIS — I10 Essential (primary) hypertension: Secondary | ICD-10-CM | POA: Insufficient documentation

## 2020-05-29 DIAGNOSIS — K529 Noninfective gastroenteritis and colitis, unspecified: Secondary | ICD-10-CM | POA: Insufficient documentation

## 2020-05-29 DIAGNOSIS — I252 Old myocardial infarction: Secondary | ICD-10-CM

## 2020-05-29 DIAGNOSIS — Z79899 Other long term (current) drug therapy: Secondary | ICD-10-CM | POA: Diagnosis not present

## 2020-05-29 DIAGNOSIS — R001 Bradycardia, unspecified: Secondary | ICD-10-CM

## 2020-05-29 LAB — BASIC METABOLIC PANEL
Anion gap: 7 (ref 5–15)
BUN: 12 mg/dL (ref 8–23)
CO2: 29 mmol/L (ref 22–32)
Calcium: 8.8 mg/dL — ABNORMAL LOW (ref 8.9–10.3)
Chloride: 104 mmol/L (ref 98–111)
Creatinine, Ser: 0.98 mg/dL (ref 0.61–1.24)
GFR, Estimated: >60 mL/min (ref >60)
Glucose, Bld: 104 mg/dL — ABNORMAL HIGH (ref 70–99)
Potassium: 4.3 mmol/L (ref 3.5–5.1)
Sodium: 140 mmol/L (ref 135–145)

## 2020-05-29 LAB — CBC
HCT: 44 % (ref 39.0–52.0)
Hemoglobin: 15 g/dL (ref 13.0–17.0)
MCH: 29.6 pg (ref 26.0–34.0)
MCHC: 34.1 g/dL (ref 30.0–36.0)
MCV: 87 fL (ref 80.0–100.0)
Platelets: 247 10*3/uL (ref 150–400)
RBC: 5.06 MIL/uL (ref 4.22–5.81)
RDW: 12.5 % (ref 11.5–15.5)
WBC: 7.1 10*3/uL (ref 4.0–10.5)
nRBC: 0 % (ref 0.0–0.2)

## 2020-05-29 LAB — TSH: TSH: 0.223 u[IU]/mL — ABNORMAL LOW (ref 0.350–4.500)

## 2020-05-29 LAB — T4, FREE: Free T4: 1.55 ng/dL — ABNORMAL HIGH (ref 0.61–1.12)

## 2020-05-29 LAB — TROPONIN I (HIGH SENSITIVITY): Troponin I (High Sensitivity): 9 ng/L (ref <18)

## 2020-05-29 MED ORDER — SODIUM CHLORIDE 0.9 % IV BOLUS
500.0000 mL | Freq: Once | INTRAVENOUS | Status: AC
  Administered 2020-05-29: 500 mL via INTRAVENOUS

## 2020-05-29 MED ORDER — ONDANSETRON HCL 4 MG PO TABS: 4.0000 mg | ORAL_TABLET | Freq: Four times a day (QID) | ORAL | 0 refills | Status: AC | PRN

## 2020-05-29 NOTE — Discharge Instructions (Signed)
-  EKG is reassuring, no acute abnormalities noted -BP is elevated at 160/87 and recheck is 157/94. This is too high. Keep taking meds as prescribed and keep log to follow up with cardiologist. Call Monday.  -As we discussed there are many causes of dizziness and I am unable to rule out a problem with your heart based on EKG alone. If symptoms continue or worsen, call 911 or go to ED. Have someone take you. If you have chest pain, breathing difficulty, headaches, vision changes, weakness, vomiting, palpitations, do not wait to get examined in ED! -I have printed prescription for Zofran for nausea. Increase fluids and rest. Careful with position changes.

## 2020-05-29 NOTE — Discharge Instructions (Addendum)
Follow-up with your primary care doctor and cardiologist next week as discussed.  Make sure you drink plenty of fluids, and you may increase the frequency of your Imodium as discussed with your doctor.  You can go down on the dose of the levothyroxine to 150 mg daily.  Return to the ER for new, worsening, or persistent severe dizziness, lightheadedness, weakness, headache, vomiting, fever, chest pain, or any other new or worsening symptoms that concern you.

## 2020-05-29 NOTE — ED Triage Notes (Signed)
Pt comes into the ED via POV c/o dizziness that started yesterday morning and then went away.  The patient then woke up again with the dizziness this morning but it hasn't gone away.  Pt has h/o MI 10 years ago.  Pt does have thyroid problems as well.  Pt states his stomach has been a little upset as well.  Pt neurologically intact at this time, ambulatory to triage, and has even and unlabored respirations.

## 2020-05-29 NOTE — ED Notes (Signed)
See triage note  Presents with dizziness which started yesterday  Denies any pain

## 2020-05-29 NOTE — ED Triage Notes (Signed)
Patient states he woke up yesterday morning with elevated BP, light headedness and nauseated. He states he woke up this morning feeling the same. Denies chest pain, denies SOB.

## 2020-05-29 NOTE — ED Provider Notes (Signed)
[facility]    CSN: [id number] Arrival date & time: 05/29/20  4742      History   Chief Complaint Chief Complaint  Patient presents with  . Hypertension  . Dizziness    HPI Robert Ray is a 70 y.o. male presenting for onset of feeling lightheaded and nauseated when he woke up yesterday.  Patient states that the symptoms seem to come whenever he changes positions from sitting to standing.  He did take his blood pressure which was elevated yesterday and has been elevated today.  He admits he does not keep a regular check of his blood pressure but states it normally is okay.  His metoprolol dose was just decreased from 100 to 50 mg about 4 to 5 months ago.  He says he has not really checked his blood pressure since his medication was adjusted.  He also takes lisinopril.  Patient says symptoms are little bit worse today than they were yesterday.  He denies any associated chest pain, palpitations, shortness of breath, headaches, vision changes, numbness/weakness/tingling, syncope or presyncope, falls, vomiting or diarrhea.  Patient had previous inferior MI in 2011.  He continues to follow-up with cardiology and says his last stress test was about 2 to 3 years ago.  He was not told of any abnormalities.  He continues to take metoprolol, lisinopril and Plavix.  Patient also admits to chronic GI problems.  He states that he has diarrhea every single day.  He does follow-up with GI specialist and says they have done extensive work-up and have not been able to find out why he has this chronic diarrhea.  He takes Imodium once a day for diarrhea.  Patient denies any diarrhea today and denies any excessive diarrhea yesterday.  Patient denies concern for dehydration and states that he tries to stay hydrated but does admit to drinking a lot of coffee.  Patient denies any ill symptoms such as cough, congestion, runny nose, sore throat, ear pressure/popping or tinnitus.  No fevers.  No  abdominal pain or constipation.  No exposure to COVID-19.  No other complaints or concerns.  HPI  Past Medical History:  Diagnosis Date  . Coronary artery disease   . Erectile disorder due to medical condition in male patient   . GERD (gastroesophageal reflux disease)   . Hypertension   . Hypothyroidism   . Myocardial infarction ([place])     There are no problems to display for this patient.   Past Surgical History:  Procedure Laterality Date  . COLONOSCOPY WITH PROPOFOL N/A 05/08/2015   Procedure: COLONOSCOPY WITH PROPOFOL;  Surgeon: Manya Silvas, MD;  Location: [facility] ENDOSCOPY;  Service: Endoscopy;  Laterality: N/A;  . COLONOSCOPY WITH PROPOFOL N/A 10/10/2018   Procedure: COLONOSCOPY WITH PROPOFOL;  Surgeon: Toledo, Benay Pike, MD;  Location: [facility] ENDOSCOPY;  Service: Gastroenterology;  Laterality: N/A;  . CORONARY ANGIOPLASTY    . ESOPHAGOGASTRODUODENOSCOPY (EGD) WITH PROPOFOL N/A 04/07/2017   Procedure: ESOPHAGOGASTRODUODENOSCOPY (EGD) WITH PROPOFOL;  Surgeon: Manya Silvas, MD;  Location: [facility] ENDOSCOPY;  Service: Endoscopy;  Laterality: N/A;  . ESOPHAGOGASTRODUODENOSCOPY (EGD) WITH PROPOFOL N/A 10/10/2018   Procedure: ESOPHAGOGASTRODUODENOSCOPY (EGD) WITH PROPOFOL;  Surgeon: Toledo, Benay Pike, MD;  Location: [facility] ENDOSCOPY;  Service: Gastroenterology;  Laterality: N/A;  . HEMORRHOIDECTOMY WITH HEMORRHOID BANDING         Home Medications    Prior to Admission medications   Medication Sig Start Date End Date Taking? Authorizing Provider  atorvastatin (LIPITOR) 40 MG tablet Take 40  mg by mouth daily.   Yes [provider]  clopidogrel (PLAVIX) 75 MG tablet Take 75 mg by mouth daily.   Yes [provider]  levothyroxine (SYNTHROID, LEVOTHROID) 150 MCG tablet Take 175 mcg by mouth daily before breakfast.    Yes [provider]  lisinopril (PRINIVIL,ZESTRIL) 5 MG tablet Take 5 mg by mouth daily.   Yes [provider]  metoprolol succinate  (TOPROL-XL) 100 MG 24 hr tablet Take 50 mg by mouth daily. Take with or immediately following a meal.   Yes [provider]  nitroGLYCERIN (NITROSTAT) 0.4 MG SL tablet Place 0.4 mg under the tongue every 5 (five) minutes as needed for chest pain (Take up to 3 doses).   Yes [provider]  ondansetron (ZOFRAN) 4 MG tablet Take 1 tablet (4 mg total) by mouth every 6 (six) hours as needed for up to 5 days for nausea or vomiting. 05/29/20 06/03/20 Yes Laurene Footman B, PA-C  tadalafil (CIALIS) 20 MG tablet Take 20 mg by mouth daily as needed for erectile dysfunction.   Yes [provider]  albuterol (PROVENTIL HFA;VENTOLIN HFA) 108 (90 Base) MCG/ACT inhaler Inhale 2 puffs into the lungs every 6 (six) hours as needed for wheezing or shortness of breath.    [provider]  aspirin 81 MG EC tablet Take 81 mg by mouth daily. Swallow whole.    [provider]  fluticasone (FLOVENT HFA) 110 MCG/ACT inhaler Inhale into the lungs 2 (two) times daily.    [provider]  levothyroxine (SYNTHROID, LEVOTHROID) 25 MCG tablet Take 25 mcg by mouth daily before breakfast.    [provider]    Family History History reviewed. No pertinent family history.  Social History Social History   Tobacco Use  . Smoking status: Former Research scientist (life sciences)  . Smokeless tobacco: Never Used  Vaping Use  . Vaping Use: Never used  Substance Use Topics  . Alcohol use: Yes    Comment: occ.  . Drug use: Never     Allergies   Patient has no known allergies.   Review of Systems Review of Systems  Constitutional: Negative for diaphoresis, fatigue and fever.  HENT: Negative for congestion, rhinorrhea, sinus pressure, sinus pain and sore throat.   Respiratory: Negative for cough and shortness of breath.   Cardiovascular: Negative for chest pain, palpitations and leg swelling.  Gastrointestinal: Positive for diarrhea and nausea. Negative for abdominal pain, constipation and  vomiting.  Genitourinary: Negative for dysuria and frequency.  Musculoskeletal: Negative for back pain and myalgias.  Neurological: Positive for dizziness and light-headedness. Negative for tremors, syncope, weakness, numbness and headaches.  Hematological: Negative for adenopathy.     Physical Exam Triage Vital Signs ED Triage Vitals  Enc Vitals Group     BP 05/29/20 0931 (!) 160/87     Pulse Rate 05/29/20 0931 (!) 51     Resp 05/29/20 0931 18     Temp 05/29/20 0931 98.1 F (36.7 C)     Temp Source 05/29/20 0931 Oral     SpO2 05/29/20 0931 99 %     Weight 05/29/20 0925 235 lb (106.6 kg)     Height 05/29/20 0925 6\' 3"  (1.905 m)     Head Circumference --      Peak Flow --      Pain Score 05/29/20 0925 0     Pain Loc --      Pain Edu? --      Excl. in [place]? --  No data found.  Updated Vital Signs BP (!) 157/94   Pulse (!) 51   Temp 98.1 F (36.7 C) (Oral)   Resp 18   Ht 6\' 3"  (1.905 m)   Wt 235 lb (106.6 kg)   SpO2 99%   BMI 29.37 kg/m       Physical Exam Vitals and nursing note reviewed.  Constitutional:      General: He is not in acute distress.    Appearance: Normal appearance. He is well-developed. He is not ill-appearing or diaphoretic.  HENT:     Head: Normocephalic and atraumatic.     Right Ear: Hearing, tympanic membrane, ear canal and external ear normal.     Left Ear: Hearing, ear canal and external ear normal. A middle ear effusion (mild) is present.     Nose: Nose normal.     Mouth/Throat:     Mouth: Mucous membranes are moist.     Pharynx: Oropharynx is clear.  Eyes:     General: No scleral icterus.    Conjunctiva/sclera: Conjunctivae normal.  Cardiovascular:     Rate and Rhythm: Regular rhythm. Bradycardia present.     Pulses: Normal pulses.     Heart sounds: Normal heart sounds.  Pulmonary:     Effort: Pulmonary effort is normal. No respiratory distress.     Breath sounds: Normal breath sounds.  Abdominal:     General: Bowel sounds are  normal.     Palpations: Abdomen is soft.     Tenderness: There is no abdominal tenderness. There is no right CVA tenderness, left CVA tenderness, guarding or rebound.  Musculoskeletal:     Cervical back: Neck supple.  Skin:    General: Skin is warm and dry.  Neurological:     General: No focal deficit present.     Mental Status: He is alert and oriented to person, place, and time. Mental status is at baseline.     Cranial Nerves: No cranial nerve deficit.     Motor: No weakness.     Gait: Gait normal.  Psychiatric:        Mood and Affect: Mood normal.        Behavior: Behavior normal.        Thought Content: Thought content normal.      UC Treatments / Results  Labs (all labs ordered are listed, but only abnormal results are displayed) Labs Reviewed - No data to display  EKG   Radiology No results found.  Procedures ED EKG  Date/Time: 05/29/2020 10:41 AM Performed by: Danton Clap, PA-C Authorized by: Danton Clap, PA-C   ECG reviewed by ED Physician in the absence of a cardiologist: yes   Previous ECG:    Previous ECG:  Unavailable Interpretation:    Interpretation: abnormal     Details:  Sinus bradycardia Rate:    ECG rate:  51   ECG rate assessment: bradycardic   Rhythm:    Rhythm: sinus bradycardia   Ectopy:    Ectopy: none   QRS:    QRS axis:  Normal ST segments:    ST segments:  Normal T waves:    T waves: normal   Comments:     Sinus bradycardia   (including critical care time)  Medications Ordered in UC Medications - No data to display  Initial Impression / Assessment and Plan / UC Course  I have reviewed the triage vital signs and the nursing notes.  Pertinent labs & imaging results that were  available during my care of the patient were reviewed by me and considered in my medical decision making (see chart for details).    70 year old male presenting for dizziness/lightheadedness and elevated blood pressures since yesterday.  No  other symptoms to report.  History significant for MI in 2011.  BP is 160/87. HR is 51. Reviewed cardiology office notes and notes from primary care which show heart rates in the 50's-60s consistently.  EKG shows sinus bradycardia. No acute ST-T wave changes  BP recheck is 157/94  Exam is reassuring.  Reviewed results of EKG with patient.  I did offer to do some basic labs including CBC, CMP and urinalysis to assess for possible signs of dehydration or electrolyte disturbances especially since he does have chronic diarrhea.  Or, advised patient to go to the emergency department where he may require work-up for cardiac problem given his history of previous MI in his current symptoms of lightheadedness and elevated blood pressure.  Patient has decided to hold off on any labs performed here today and states he will monitor his symptoms and if his symptoms worsen he will go to the emergency department.  He says he would also keep a log of his blood pressure and follow-up with his cardiologist on Monday.    Since he is not going right to the ER, I did print a prescription for Zofran in case he needs to fill that for his nausea.  Thoroughly reviewed ED precautions with patient and advised him that I cannot rule out a cardiac problem based on his EKG alone.  He does not show signs of chest pain, breathing problem or other common presentation for MI, but I cannot rule it out.  Patient is understanding and agreeable to this.   Final Clinical Impressions(s) / UC Diagnoses   Final diagnoses:  Dizziness  Essential hypertension  Nausea without vomiting  History of MI (myocardial infarction)  Bradycardia     Discharge Instructions     -EKG is reassuring, no acute abnormalities noted -BP is elevated at 160/87 and recheck is 157/94. This is too high. Keep taking meds as prescribed and keep log to follow up with cardiologist. Call Monday.  -As we discussed there are many causes of dizziness and I am  unable to rule out a problem with your heart based on EKG alone. If symptoms continue or worsen, call 911 or go to ED. Have someone take you. If you have chest pain, breathing difficulty, headaches, vision changes, weakness, vomiting, palpitations, do not wait to get examined in ED! -I have printed prescription for Zofran for nausea. Increase fluids and rest. Careful with position changes.     ED Prescriptions    Medication Sig Dispense Auth. Provider   ondansetron (ZOFRAN) 4 MG tablet Take 1 tablet (4 mg total) by mouth every 6 (six) hours as needed for up to 5 days for nausea or vomiting. 15 tablet Gretta Cool     PDMP not reviewed this encounter.   Danton Clap, PA-C 05/29/20 1601

## 2020-05-29 NOTE — ED Provider Notes (Signed)
[facility] Emergency Department Provider Note ____________________________________________   Event Date/Time   First MD Initiated Contact with Patient 05/29/20 1218     (approximate)  I have reviewed the triage vital signs and the nursing notes.   HISTORY  Chief Complaint Dizziness, Abdominal Pain, and Hypertension    HPI Robert Ray is a 70 y.o. male with PMH as noted below including CAD, hypertension, hypothyroidism and GERD presents with lightheadedness, acute onset yesterday morning when he awoke.  It is associated with nausea but no vomiting.  The patient states that it improved over the course of the day yesterday and resolved.  However, it started again this morning and has not improved.  He denies any associated chest pain, abdominal pain, headache, vomiting, fever, or shortness of breath.  He has chronic diarrhea for which he is currently being worked up but this has not changed recently.  Past Medical History:  Diagnosis Date  . Coronary artery disease   . Erectile disorder due to medical condition in male patient   . GERD (gastroesophageal reflux disease)   . Hypertension   . Hypothyroidism   . Myocardial infarction ([place])     There are no problems to display for this patient.   Past Surgical History:  Procedure Laterality Date  . COLONOSCOPY WITH PROPOFOL N/A 05/08/2015   Procedure: COLONOSCOPY WITH PROPOFOL;  Surgeon: Manya Silvas, MD;  Location: [facility] ENDOSCOPY;  Service: Endoscopy;  Laterality: N/A;  . COLONOSCOPY WITH PROPOFOL N/A 10/10/2018   Procedure: COLONOSCOPY WITH PROPOFOL;  Surgeon: Toledo, Benay Pike, MD;  Location: [facility] ENDOSCOPY;  Service: Gastroenterology;  Laterality: N/A;  . CORONARY ANGIOPLASTY    . ESOPHAGOGASTRODUODENOSCOPY (EGD) WITH PROPOFOL N/A 04/07/2017   Procedure: ESOPHAGOGASTRODUODENOSCOPY (EGD) WITH PROPOFOL;  Surgeon: Manya Silvas, MD;  Location: [facility] ENDOSCOPY;  Service: Endoscopy;  Laterality: N/A;   . ESOPHAGOGASTRODUODENOSCOPY (EGD) WITH PROPOFOL N/A 10/10/2018   Procedure: ESOPHAGOGASTRODUODENOSCOPY (EGD) WITH PROPOFOL;  Surgeon: Toledo, Benay Pike, MD;  Location: [facility] ENDOSCOPY;  Service: Gastroenterology;  Laterality: N/A;  . HEMORRHOIDECTOMY WITH HEMORRHOID BANDING      Prior to Admission medications   Medication Sig Start Date End Date Taking? Authorizing Provider  albuterol (PROVENTIL HFA;VENTOLIN HFA) 108 (90 Base) MCG/ACT inhaler Inhale 2 puffs into the lungs every 6 (six) hours as needed for wheezing or shortness of breath.    [provider]  aspirin 81 MG EC tablet Take 81 mg by mouth daily. Swallow whole.    [provider]  atorvastatin (LIPITOR) 40 MG tablet Take 40 mg by mouth daily.    [provider]  clopidogrel (PLAVIX) 75 MG tablet Take 75 mg by mouth daily.    [provider]  fluticasone (FLOVENT HFA) 110 MCG/ACT inhaler Inhale into the lungs 2 (two) times daily.    [provider]  levothyroxine (SYNTHROID, LEVOTHROID) 150 MCG tablet Take 175 mcg by mouth daily before breakfast.     [provider]  levothyroxine (SYNTHROID, LEVOTHROID) 25 MCG tablet Take 25 mcg by mouth daily before breakfast.    [provider]  lisinopril (PRINIVIL,ZESTRIL) 5 MG tablet Take 5 mg by mouth daily.    [provider]  metoprolol succinate (TOPROL-XL) 100 MG 24 hr tablet Take 50 mg by mouth daily. Take with or immediately following a meal.    [provider]  nitroGLYCERIN (NITROSTAT) 0.4 MG SL tablet Place 0.4 mg under the tongue every 5 (five) minutes as needed for chest pain (Take up  to 3 doses).    [provider]  ondansetron (ZOFRAN) 4 MG tablet Take 1 tablet (4 mg total) by mouth every 6 (six) hours as needed for up to 5 days for nausea or vomiting. 05/29/20 06/03/20  Laurene Footman B, PA-C  tadalafil (CIALIS) 20 MG tablet Take 20 mg by mouth daily as needed for erectile dysfunction.    [provider]    Allergies Patient has no known allergies.  History reviewed. No pertinent family history.  Social History Social History   Tobacco Use  . Smoking status: Former Research scientist (life sciences)  . Smokeless tobacco: Never Used  Vaping Use  . Vaping Use: Never used  Substance Use Topics  . Alcohol use: Yes    Comment: occ.  . Drug use: Never    Review of Systems  Constitutional: No fever. Eyes: No redness. ENT: No sore throat. Cardiovascular: Denies chest pain. Respiratory: Denies shortness of breath. Gastrointestinal: Positive for nausea. Genitourinary: Negative for dysuria.  Musculoskeletal: Negative for back pain. Skin: Negative for rash. Neurological: Negative for headaches, focal weakness or numbness.   ____________________________________________   PHYSICAL EXAM:  VITAL SIGNS: ED Triage Vitals  Enc Vitals Group     BP 05/29/20 1216 135/87     Pulse Rate 05/29/20 1216 (!) 55     Resp 05/29/20 1216 19     Temp 05/29/20 1216 97.7 F (36.5 C)     Temp Source 05/29/20 1216 Oral     SpO2 05/29/20 1216 99 %     Weight 05/29/20 1213 235 lb (106.6 kg)     Height 05/29/20 1213 6\' 3"  (1.905 m)     Head Circumference --      Peak Flow --      Pain Score 05/29/20 1213 0     Pain Loc --      Pain Edu? --      Excl. in [place]? --     Constitutional: Alert and oriented. Well appearing and in no acute distress. Eyes: Conjunctivae are normal.  EOMI.  PR leg. Head: Atraumatic. Nose: No congestion/rhinnorhea. Mouth/Throat: Mucous membranes are moist.   Neck: Normal range of motion.  Cardiovascular: Normal rate, regular rhythm. Grossly normal heart sounds.  Good peripheral circulation. Respiratory: Normal respiratory effort.  No retractions. Lungs CTAB. Gastrointestinal: Soft and nontender. No distention.  Genitourinary: No flank tenderness. Musculoskeletal: No lower extremity edema.  Extremities warm and well perfused.  Neurologic:  Normal speech and language.  No  pronator drift.  Normal coordination with no ataxia on finger-to-nose.  Motor intact in all extremities. Skin:  Skin is warm and dry. No rash noted. Psychiatric: Mood and affect are normal. Speech and behavior are normal.  ____________________________________________   LABS (all labs ordered are listed, but only abnormal results are displayed)  Labs Reviewed  BASIC METABOLIC PANEL - Abnormal; Notable for the following components:      Result Value   Glucose, Bld 104 (*)    Calcium 8.8 (*)    All other components within normal limits  TSH - Abnormal; Notable for the following components:   TSH 0.223 (*)    All other components within normal limits  T4, FREE - Abnormal; Notable for the following components:   Free T4 1.55 (*)    All other components within normal limits  CBC  URINALYSIS, COMPLETE (UACMP) WITH MICROSCOPIC  TROPONIN I (HIGH SENSITIVITY)   ____________________________________________  EKG  ED ECG REPORT I, Arta Silence, the attending physician, personally viewed and interpreted  this ECG.  Date: 05/29/2020 EKG Time: 1215 Rate: 53 Rhythm: normal sinus rhythm QRS Axis: normal Intervals: normal ST/T Wave abnormalities: normal Narrative Interpretation: no evidence of acute ischemia  ____________________________________________  RADIOLOGY  CT head: No ICH or other acute abnormality  ____________________________________________   PROCEDURES  Procedure(s) performed: No  Procedures  Critical Care performed: No ____________________________________________   INITIAL IMPRESSION / ASSESSMENT AND PLAN / ED COURSE  Pertinent labs & imaging results that were available during my care of the patient were reviewed by me and considered in my medical decision making (see chart for details).  70 year old male with PMH as noted above including CAD, hypertension, hypothyroidism and GERD presents with nonspecific lightheadedness yesterday and today associated  with nausea but no vomiting or other acute symptoms.  On further discussion with him it does not appear that the dizziness has a vertigo component.  The patient was seen in urgent care and referred to the ED.  I reviewed the past medical records in [place].  The patient has no recent prior ED visits or admissions.  On exam today he is overall well-appearing.  His vital signs are normal.  Heart rate is in the low 50s, sometimes dipping into the high 40s, however on review of prior visits it appears that his heart rate is usually in the 50s or 60s.  Neurologic exam is normal.  The physical exam is otherwise unremarkable.  Differential includes dehydration, electrolyte abnormality, kidney injury, hypothyroidism, UTI, or less likely cardiac etiology.  I have a low suspicion for CNS cause given the lack of any focal neurologic symptoms and the intermittent nature of the dizziness.  We will obtain a CT head, basic and cardiac labs, TSH and T4, urinalysis, and reassess.  ----------------------------------------- 3:06 PM on 05/29/2020 -----------------------------------------  Lab work-up is unremarkable except for slightly elevated T4 and decreased TSH.  I have advised the patient to go down from 175 to 150 mcg of levothyroxine.  Initial troponin is negative.  Given that the symptoms started yesterday and the patient has no specific symptoms of ACS, there is no indication for a repeat.  CT head shows no acute abnormality.  The patient states he feels well and wants to go home.  He is ambulating around the ED with no difficulty and states that the dizziness and nausea have improved after fluids.  He has not given urinalysis yet but denies any urinary symptoms and states he would prefer to just go home.  I think that this is reasonable.  His vital signs have remained stable.  Overall I suspect he is having mild dehydration related to the chronic diarrhea.  At this time, the patient is stable for discharge  home.  Return precautions given, he expresses understanding.  ____________________________________________   FINAL CLINICAL IMPRESSION(S) / ED DIAGNOSES  Final diagnoses:  Lightheadedness      NEW MEDICATIONS STARTED DURING THIS VISIT:  New Prescriptions   No medications on file     Note:  This document was prepared using Dragon voice recognition software and may include unintentional dictation errors.    Arta Silence, MD 05/29/20 [phone]

## 2020-05-29 NOTE — ED Notes (Signed)
See triage note, pt reports dizziness that started yesterday, +nausea Pt alert and oriented, clear speech, NAD noted

## 2020-12-01 ENCOUNTER — Other Ambulatory Visit: Payer: Self-pay

## 2021-07-31 IMAGING — CT CT HEAD W/O CM
3 series · 16 of 47 positions shown, 19 images · non-contrast
Comparison: None.

CLINICAL DATA: Episodes of dizziness yesterday and today.

EXAM:
CT HEAD WITHOUT CONTRAST
TECHNIQUE: Contiguous axial images were obtained from the base of the skull
through the vertex without intravenous contrast.

[Series 2: head wo · axial · 0.45mm/px · z∈[+236,+371]mm · 10 of 33 slices shown, 13 images]
[im 3/33  brain]
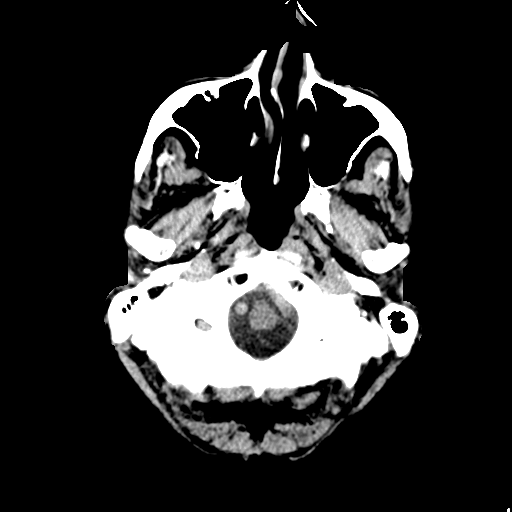
[im 3/33  bone]
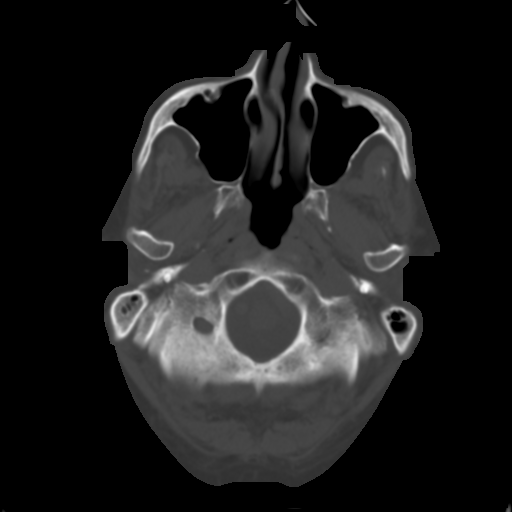
[im 6/33  brain]
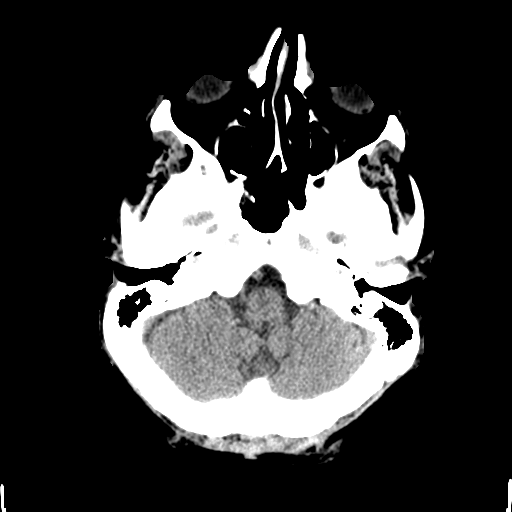
[im 9/33  brain]
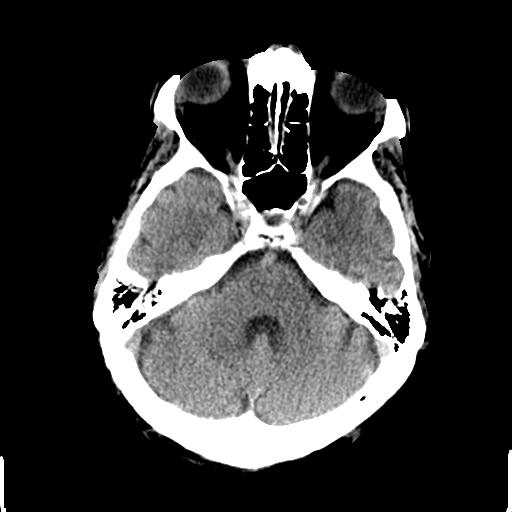
[im 12/33  brain]
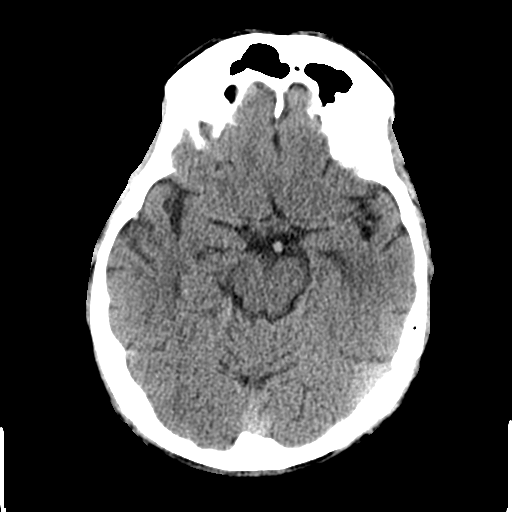
[im 15/33  brain]
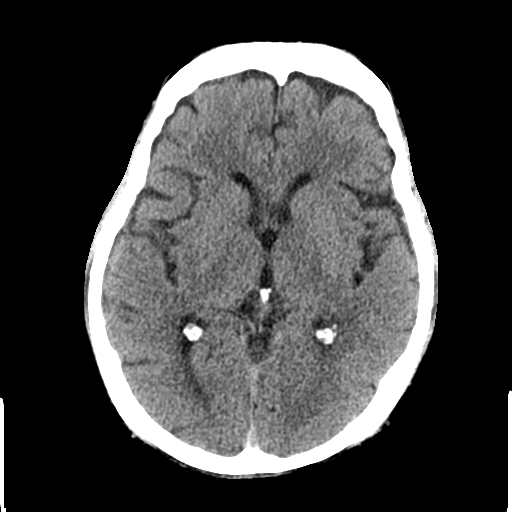
[im 15/33  bone]
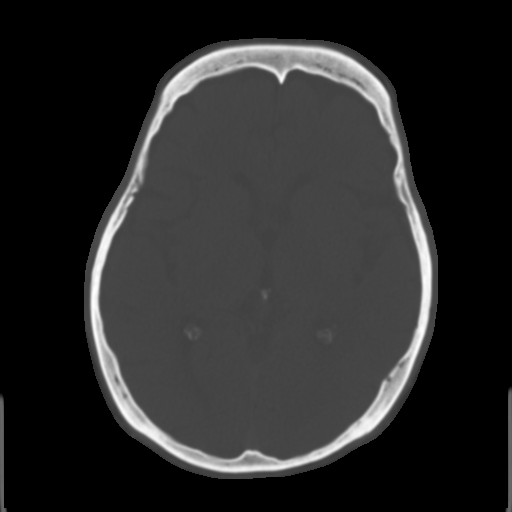
[im 18/33  brain]
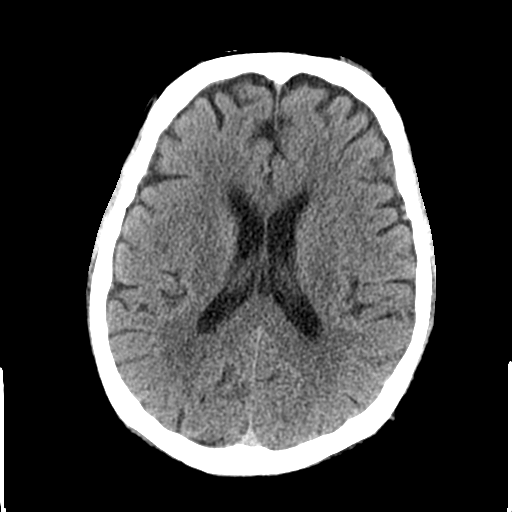
[im 21/33  brain]
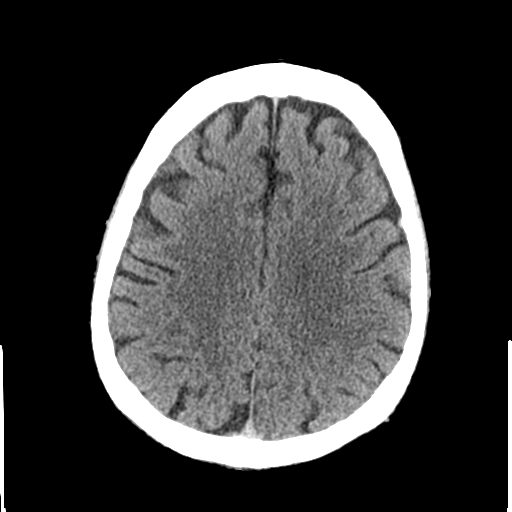
[im 25/33  brain]
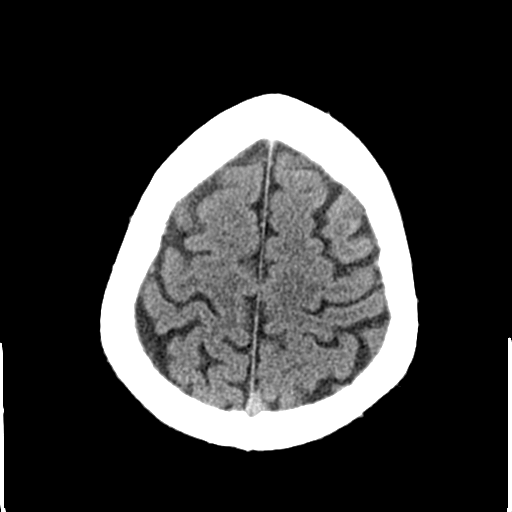
[im 27/33  brain]
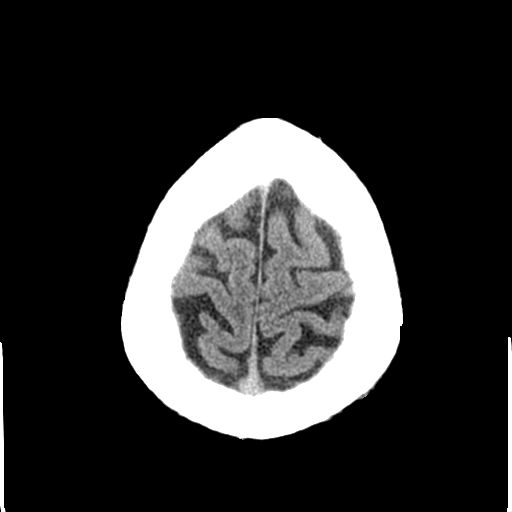
[im 27/33  bone]
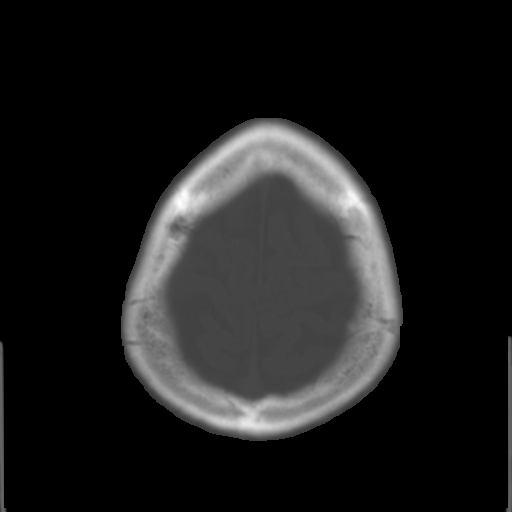
[im 30/33  brain]
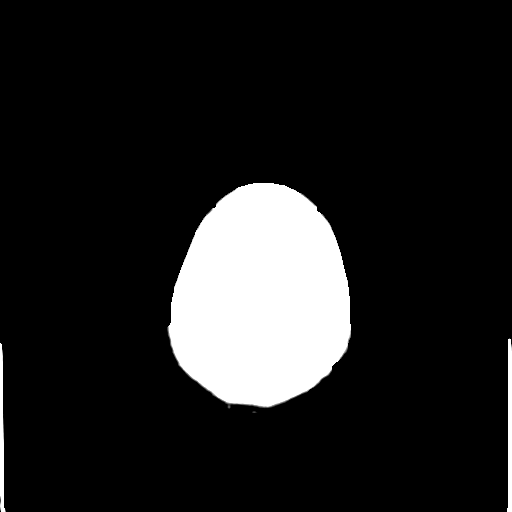

[Series 4: coronal soft tissue · coronal · 0.32mm/px · 3 of 71 slices shown]
[im 24/71  brain]
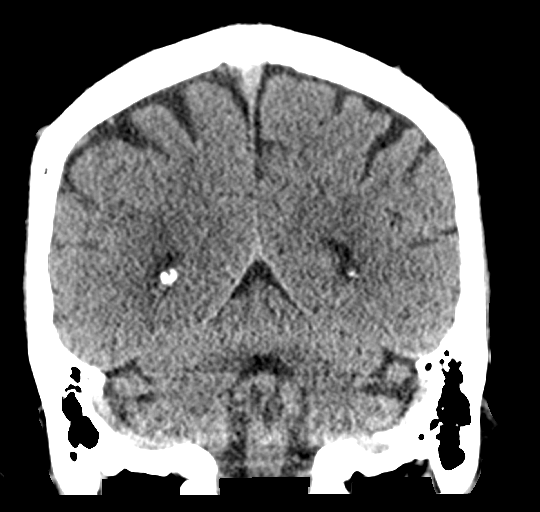
[im 32/71  brain]
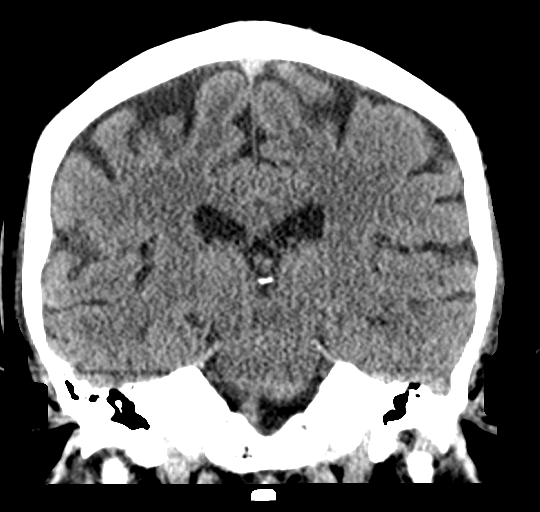
[im 39/71  brain]
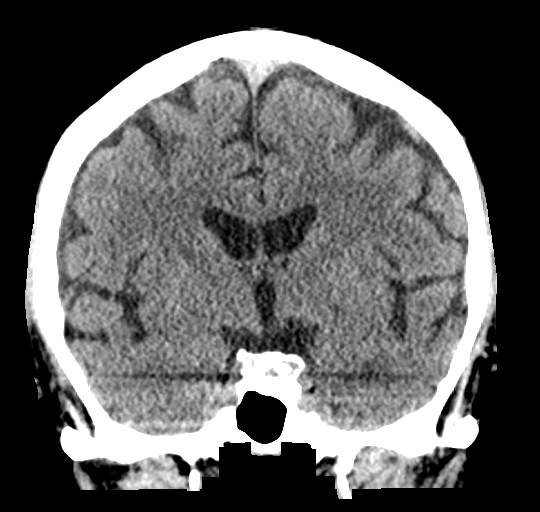

[Series 5: sagittal soft tissue · sagittal · 0.36mm/px · 3 of 58 slices shown]
[im 20/58  brain]
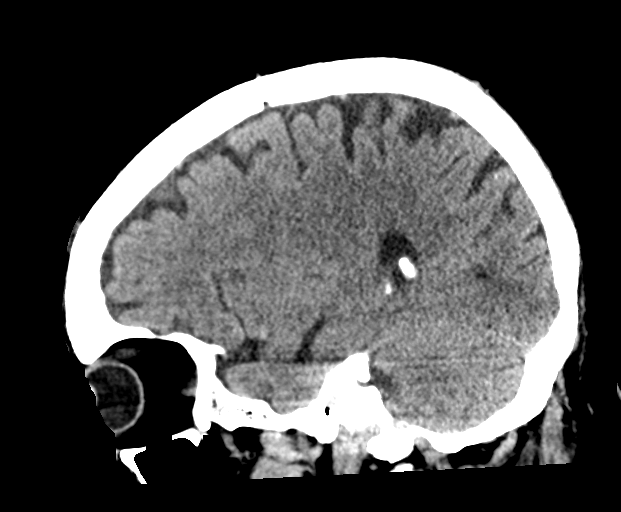
[im 29/58  brain]
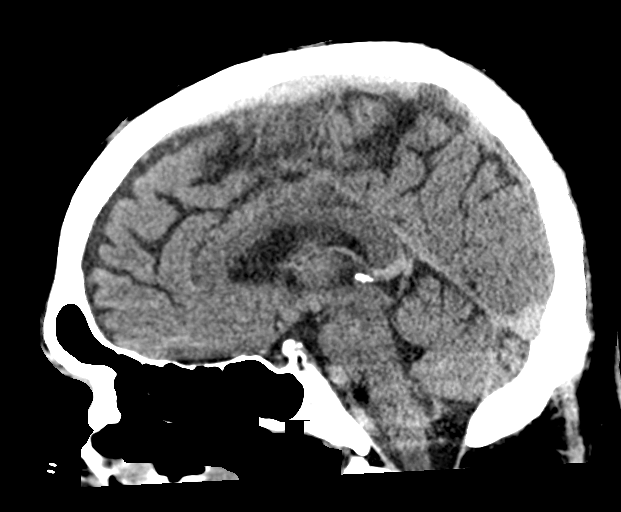
[im 39/58  brain]
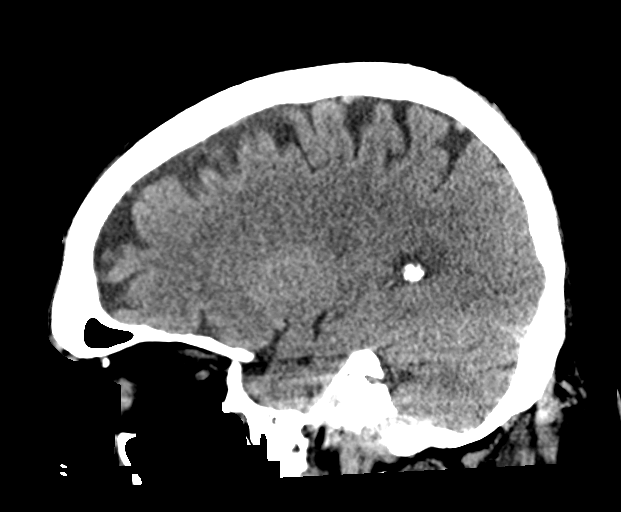

[16 of 47 positions shown; findings below may reference images not displayed]

FINDINGS: Brain: No evidence of acute infarction, hemorrhage, hydrocephalus,
extra-axial collection or mass lesion/mass effect.

Vascular: No hyperdense vessel or unexpected calcification.

Skull: Intact.  No focal lesion.

Sinuses/Orbits: Negative.

Other: None.
IMPRESSION: Negative head CT.

## 2021-11-23 ENCOUNTER — Encounter: Payer: Self-pay | Admitting: Urology

## 2021-11-23 ENCOUNTER — Other Ambulatory Visit: Payer: Self-pay | Admitting: *Deleted

## 2021-11-23 ENCOUNTER — Ambulatory Visit: Payer: Medicare Other | Admitting: Urology

## 2021-11-23 ENCOUNTER — Other Ambulatory Visit
Admission: RE | Admit: 2021-11-23 | Discharge: 2021-11-23 | Disposition: A | Discharge status: Home / Self Care | Payer: Medicare Other | Attending: Urology | Admitting: Urology

## 2021-11-23 VITALS — BP 122/75 | HR 71 | Ht 75.0 in | Wt 243.0 lb

## 2021-11-23 DIAGNOSIS — N5089 Other specified disorders of the male genital organs: Secondary | ICD-10-CM

## 2021-11-23 DIAGNOSIS — N432 Other hydrocele: Secondary | ICD-10-CM

## 2021-11-23 LAB — URINALYSIS, COMPLETE (UACMP) WITH MICROSCOPIC
Bacteria, UA: NONE SEEN
Glucose, UA: NEGATIVE mg/dL
Hgb urine dipstick: NEGATIVE
Ketones, ur: NEGATIVE mg/dL
Leukocytes,Ua: NEGATIVE
Nitrite: NEGATIVE
Specific Gravity, Urine: 1.025 (ref 1.005–1.030)
pH: 5.5 (ref 5.0–8.0)

## 2021-11-23 NOTE — Progress Notes (Signed)
   11/23/21 1:38 PM   Robert Ray 06/06/1950 [id number]  CC: Left scrotal swelling  HPI: 71 year old male with history of CAD on anticoagulation with Plavix, ED well-controlled on Cialis, he reports some slight increase in left scrotal swelling.  He was originally diagnosed with a hydrocele in 2018 by Dr. Jacqlyn Larsen and opted for observation.  He was camping over the weekend and noticed some increased pressure in the left groin that has since resolved.  He thinks maybe this is increased slightly in size since 2018.  He is not particularly bothered by the scrotal swelling.   PMH: Past Medical History:  Diagnosis Date   Coronary artery disease    Erectile disorder due to medical condition in male patient    GERD (gastroesophageal reflux disease)    Hypertension    Hypothyroidism    Myocardial infarction [facility])     Surgical History: Past Surgical History:  Procedure Laterality Date   COLONOSCOPY WITH PROPOFOL N/A 05/08/2015   Procedure: COLONOSCOPY WITH PROPOFOL;  Surgeon: Manya Silvas, MD;  Location: [facility] ENDOSCOPY;  Service: Endoscopy;  Laterality: N/A;   COLONOSCOPY WITH PROPOFOL N/A 10/10/2018   Procedure: COLONOSCOPY WITH PROPOFOL;  Surgeon: Toledo, Benay Pike, MD;  Location: [facility] ENDOSCOPY;  Service: Gastroenterology;  Laterality: N/A;   CORONARY ANGIOPLASTY     ESOPHAGOGASTRODUODENOSCOPY (EGD) WITH PROPOFOL N/A 04/07/2017   Procedure: ESOPHAGOGASTRODUODENOSCOPY (EGD) WITH PROPOFOL;  Surgeon: Manya Silvas, MD;  Location: [facility] ENDOSCOPY;  Service: Endoscopy;  Laterality: N/A;   ESOPHAGOGASTRODUODENOSCOPY (EGD) WITH PROPOFOL N/A 10/10/2018   Procedure: ESOPHAGOGASTRODUODENOSCOPY (EGD) WITH PROPOFOL;  Surgeon: Toledo, Benay Pike, MD;  Location: [facility] ENDOSCOPY;  Service: Gastroenterology;  Laterality: N/A;   HEMORRHOIDECTOMY WITH HEMORRHOID BANDING       Family History: No family history on file.  Social History:  reports that he has quit smoking. He has been exposed to  tobacco smoke. He has never used smokeless tobacco. He reports current alcohol use. He reports that he does not use drugs.  Physical Exam: BP 122/75   Pulse 71   Ht '6\' 3"'$  (1.905 m)   Wt 243 lb (110.2 kg)   BMI 30.37 kg/m    Constitutional:  Alert and oriented, No acute distress. Cardiovascular: No clubbing, cyanosis, or edema. Respiratory: Normal respiratory effort, no increased work of breathing. GI: Abdomen is soft, nontender, nondistended, no abdominal masses GU: Phallus with patent meatus, no lesions, mild left scrotal swelling most consistent with hydrocele, no testicular masses   Pertinent Imaging: I have personally viewed and interpreted the CT from 2021 showing no obvious inguinal hernia, partially imaged small left hydrocele.  Assessment & Plan:   72 year old male with minimally bothersome left scrotal swelling most consistent with hydrocele.  We discussed options including scrotal ultrasound for further evaluation, observation, or definitive management with hydrocelectomy.  We discussed aspiration is not a good long-term option with high risk of recurrence as well as infection.  He is minimally bothered and would like to pursue observation alone at this time.  Return precautions were discussed.  Follow-up with urology as needed, okay to order scrotal ultrasound if worsening scrotal swelling or discomfort  Nickolas Madrid, MD 11/23/2021  [place] [address] [place], [place] [address] [phone]

## 2021-11-23 NOTE — Patient Instructions (Signed)
Hydrocele, Adult A hydrocele is a collection of fluid in the loose pouch of skin that holds the testicles (scrotum). It can occur in one or both testicles. This may happen because: The amount of fluid produced in the scrotum is not absorbed by the rest of the body. Fluid from the abdomen fills the scrotum. Normally, the testicles develop in the abdomen and then drop into the scrotum before birth. The tube that the testicles travel through usually closes after the testicles drop. If the tube does not close, fluid from the abdomen can fill the scrotum. This is not very common in adults. What are the causes? A hydrocele may be caused by: An injury to the scrotum. An infection. Decreased blood flow to the scrotum. Twisting of a testicle (testicular torsion). A birth defect. A tumor or cancer of the testicle. Sometimes, the cause is not known. What are the signs or symptoms? A hydrocele feels like a water-filled balloon. It may also feel heavy. Other symptoms include: Swelling of the scrotum. The swelling may decrease when you lie down. You may also notice more swelling at night than in the morning. This is called a communicating hydrocele, in which the fluid in the scrotum goes back into the abdominal cavity when the position of the scrotum changes. Swelling of the groin. Mild discomfort in the scrotum. Pain. This can develop if the hydrocele was caused by infection or twisting. The larger the hydrocele, the more likely you are to have pain. Swelling may also cause pain. How is this diagnosed? This condition may be diagnosed based on a physical exam and your medical history. You may also have tests, including: Imaging tests, such as an ultrasound. A transillumination test. This test takes place in a dark room where a light is placed on the skin of the scrotum. Clear liquid will not impede the light and the scrotum will be illuminated. This helps a health care provider distinguish a hydrocele from a  tumor. Blood or urine tests. How is this treated? Most hydroceles go away on their own. If you have no discomfort or pain, your health care provider may suggest close monitoring of your condition until the condition goes away or symptoms develop. This is called watch and wait or watchful waiting. If treatment is needed, it may include: Treating an underlying condition. This may include taking an antibiotic medicine to treat an infection. Having surgery to stop fluid from collecting in the scrotum. Having surgery to drain the fluid. Surgery may include: Hydrocelectomy. For this procedure, an incision is made in the scrotum to remove the fluid. Needle aspiration. A needle is used to drain fluid. However, the fluid buildup will come back quickly and may lead to an infection of the scrotum. This is rarely done. Follow these instructions at home: Medicines Take over-the-counter and prescription medicines only as told by your health care provider. If you were prescribed an antibiotic medicine, take it as told by your health care provider. Do not stop taking the antibiotic even if you start to feel better. General instructions Watch the hydrocele for any changes. Keep all follow-up visits. This is important. Contact a health care provider if: You notice any changes in the hydrocele. The swelling in your scrotum or groin gets worse. The hydrocele becomes red, firm, painful, or tender to the touch. You have a fever. Get help right away if you: Develop a lot of pain or your pain becomes worse. Have chills. Have a high fever. Summary A hydrocele is  a collection of fluid in the loose pouch of skin that holds the testicles (scrotum). A hydrocele can cause swelling, discomfort, and pain. In adults, the cause of a hydrocele may not be known. However, it is sometimes caused by an infection or the twisting of a testicle. Hydroceles often go away on their own. If a hydrocele causes pain, treating the  underlying cause may be needed to ease the pain. This information is not intended to replace advice given to you by your health care provider. Make sure you discuss any questions you have with your health care provider. Document Revised: 09/17/2020 Document Reviewed: 09/17/2020 Elsevier Patient Education  [place], Adult  A hydrocelectomy is a surgical procedure to remove a collection of fluid (hydrocele) from the scrotum. The scrotum is the pouch that holds the testicles. You may need to have this procedure if a hydrocele is causing painful swelling in your scrotum. Tell a health care provider about: Any allergies you have. All medicines you are taking, including vitamins, herbs, eye drops, creams, and over-the-counter medicines. Any problems you or family members have had with anesthetic medicines. Any bleeding problems you have. Any surgeries you have had. Any medical conditions you have. What are the risks? Generally, this is a safe procedure. However, problems may occur, including: Bleeding into the scrotum (scrotal hematoma). Damage to nearby structures or organs, including to the testicle or the tube that carries sperm out of the testicle (vas deferens). Infection. Allergic reactions to medicines. What happens before the procedure? When to stop eating and drinking Follow instructions from your health care provider about what you may eat and drink before your procedure. These may include: 8 hours before your procedure Stop eating most foods. Do not eat meat, fried foods, or fatty foods. Eat only light foods, such as toast or crackers. All liquids are okay except energy drinks and alcohol. 6 hours before your procedure Stop eating. Drink only clear liquids, such as water, clear fruit juice, black coffee, plain tea, and sports drinks. Do not drink energy drinks or alcohol. 2 hours before your procedure Stop drinking all liquids. You may be allowed to  take medicines with small sips of water. If you do not follow your health care provider's instructions, your procedure may be delayed or canceled. Medicines Ask your health care provider about: Changing or stopping your regular medicines. This is especially important if you are taking diabetes medicines or blood thinners. Taking medicines such as aspirin and ibuprofen. These medicines can thin your blood. Do not take these medicines unless your health care provider tells you to take them. Taking over-the-counter medicines, vitamins, herbs, and supplements. Surgery safety Ask your health care provider: How your surgery site will be marked. What steps will be taken to help prevent infection. These steps may include: Removing hair at the surgery site. Washing skin with a germ-killing soap. Taking antibiotic medicine. General instructions Do not use any products that contain nicotine or tobacco for at least 4 weeks before the procedure. These products include cigarettes, chewing tobacco, and vaping devices, such as e-cigarettes. If you need help quitting, ask your health care provider. If you will be going home right after the procedure, plan to have a responsible adult: Take you home from the hospital or clinic. You will not be allowed to drive. Care for you for the time you are told. What happens during the procedure? An IV will be inserted into one of your veins. You will be given one  or both of the following: A medicine to make you relax (sedative). A medicine to make you fall asleep (general anesthetic). A small incision will be made through the skin of your scrotum. Your testicle and the hydrocele will be located, and the hydrocele sac will be opened with an incision. The fluid will be drained from the hydrocele. Part of the hydrocele sac may be removed. The hydrocele will be closed with stitches that dissolve (absorbable sutures). This prevents fluid from building up again. If your  hydrocele is large, you may have a thin, rubber drain placed to allow fluid to drain after the procedure. The incision in your scrotum will be closed with absorbable sutures, skin glue, or adhesive strips. A bandage (dressing) will be placed over the incision. The dressing may be held in place with an athletic support strap (scrotal support). The procedure may vary among health care providers and hospitals. What happens after the procedure?  Your blood pressure, heart rate, breathing rate, and blood oxygen level will be monitored until you leave the hospital or clinic. You will be given pain medicine as needed. Your IV will be removed, and your insertion site will be checked for bleeding. You may need to wear a scrotal support. This holds the dressing in place and supports your scrotum. If you were given a sedative during the procedure, it can affect you for several hours. Do not drive or operate machinery until your health care provider says that it is safe. Summary A hydrocelectomy is a surgical procedure to remove a collection of fluid from the scrotum. You may need to have this procedure if a hydrocele is causing painful swelling in your scrotum. During the procedure, the hydrocele will be drained and then closed with stitches that dissolve (absorbable sutures). This prevents fluid from building up again. If your hydrocele is large, you may have a thin, rubber drain placed to allow fluid to drain after the procedure. You may need to wear a scrotal support after your procedure. This holds the dressing in place and supports your scrotum. This information is not intended to replace advice given to you by your health care provider. Make sure you discuss any questions you have with your health care provider. Document Revised: 09/17/2020 Document Reviewed: 09/17/2020 Elsevier Patient Education  [place].

## 2021-11-25 ENCOUNTER — Other Ambulatory Visit: Payer: Self-pay

## 2021-11-25 DIAGNOSIS — R310 Gross hematuria: Secondary | ICD-10-CM

## 2021-12-14 ENCOUNTER — Other Ambulatory Visit: Payer: Self-pay | Admitting: Urology

## 2021-12-14 ENCOUNTER — Ambulatory Visit
Admission: RE | Admit: 2021-12-14 | Discharge: 2021-12-14 | Disposition: A | Discharge status: Home / Self Care | Payer: Medicare Other | Source: Ambulatory Visit | Attending: Urology | Admitting: Urology

## 2021-12-14 DIAGNOSIS — N5089 Other specified disorders of the male genital organs: Secondary | ICD-10-CM

## 2021-12-14 DIAGNOSIS — R310 Gross hematuria: Secondary | ICD-10-CM | POA: Diagnosis present

## 2022-02-22 ENCOUNTER — Encounter: Payer: Self-pay | Admitting: Urology

## 2022-02-22 ENCOUNTER — Ambulatory Visit: Payer: Medicare Other | Admitting: Urology

## 2022-02-22 VITALS — BP 129/80 | HR 96 | Ht 75.0 in | Wt 235.0 lb

## 2022-02-22 DIAGNOSIS — N433 Hydrocele, unspecified: Secondary | ICD-10-CM | POA: Diagnosis not present

## 2022-02-22 NOTE — Progress Notes (Signed)
   02/22/2022 2:49 PM   Marya Landry 1950-03-03 [id number]  Reason for visit: Follow up left hydrocele  HPI: 72 year old male with history of CAD who remains on anticoagulation with Plavix, ED well-controlled on Cialis who I originally saw in October 2023 for some mild increase in left-sided scrotal swelling.  He was originally diagnosed with a hydrocele in 2018 by Dr. Jacqlyn Larsen and opted for observation.  A scrotal ultrasound on 12/14/2021 showed a moderate size left hydrocele but no suspicious lesions.  He originally opted to continue observation.  He made an appointment because he had some increased discomfort in the left scrotum for a few days and he felt like maybe things were increasing in size.  His symptoms have since resolved since that time and really denies any complaints today.  On exam he has a stable moderate size left hydrocele that is minimally tender.  We again reviewed options including observation, hydrocelectomy, or aspiration.  I discouraged him from pursuing aspiration with a high rate of recurrence and risk of infection.  Either observation or hydrocelectomy is reasonable.  We discussed the risk of hydrocelectomy including bleeding, infection, bruising and swelling that can last at least a few weeks, postoperative pain/soreness, and low risk of recurrence.  He would like to continue with observation.  I offered him a 1 year follow-up but he would like to follow-up as needed, and will let us know if he is interested in pursuing hydrocelectomy in the future.     Billey Co, [facility] [address] [place], [place] [address] [phone]

## 2022-02-22 NOTE — Patient Instructions (Signed)
Hydrocele, Adult A hydrocele is a collection of fluid in the loose pouch of skin that holds the testicles (scrotum). It can occur in one or both testicles. This may happen because: The amount of fluid produced in the scrotum is not absorbed by the rest of the body. Fluid from the abdomen fills the scrotum. Normally, the testicles develop in the abdomen and then drop into the scrotum before birth. The tube that the testicles travel through usually closes after the testicles drop. If the tube does not close, fluid from the abdomen can fill the scrotum. This is not very common in adults. What are the causes? A hydrocele may be caused by: An injury to the scrotum. An infection.  Sometimes, the cause is not known. What are the signs or symptoms? A hydrocele feels like a water-filled balloon. It may also feel heavy. Other symptoms include: Swelling of the scrotum. The swelling may decrease when you lie down. You may also notice more swelling at night than in the morning. This is called a communicating hydrocele, in which the fluid in the scrotum goes back into the abdominal cavity when the position of the scrotum changes. Swelling of the groin. Mild discomfort in the scrotum. Pain. This can develop if the hydrocele was caused by infection or twisting. The larger the hydrocele, the more likely you are to have pain. Swelling may also cause pain. How is this diagnosed? This condition may be diagnosed based on a physical exam and your medical history. You may also have tests, including: Imaging tests, such as an ultrasound. A transillumination test. This test takes place in a dark room where a light is placed on the skin of the scrotum. Clear liquid will not impede the light and the scrotum will be illuminated. This helps a health care provider distinguish a hydrocele from a tumor. Blood or urine tests. How is this treated? Most hydroceles go away on their own. If you have no discomfort or pain, your  health care provider may suggest close monitoring of your condition until the condition goes away or symptoms develop. This is called watch and wait or watchful waiting. If treatment is needed, it may include: Treating an underlying condition. This may include taking an antibiotic medicine to treat an infection. Having surgery to stop fluid from collecting in the scrotum. Having surgery to drain the fluid. Surgery may include: Hydrocelectomy. For this procedure, an incision is made in the scrotum to remove the fluid. Needle aspiration. A needle is used to drain fluid. However, the fluid buildup will come back quickly and may lead to an infection of the scrotum. This is rarely done. Follow these instructions at home: Medicines Take over-the-counter and prescription medicines only as told by your health care provider. If you were prescribed an antibiotic medicine, take it as told by your health care provider. Do not stop taking the antibiotic even if you start to feel better. General instructions Watch the hydrocele for any changes. Keep all follow-up visits. This is important. Contact a health care provider if: You notice any changes in the hydrocele. The swelling in your scrotum or groin gets worse. The hydrocele becomes red, firm, painful, or tender to the touch. You have a fever. Get help right away if you: Develop a lot of pain or your pain becomes worse. Have chills. Have a high fever. Summary A hydrocele is a collection of fluid in the loose pouch of skin that holds the testicles (scrotum). A hydrocele can cause swelling, discomfort,  and pain. In adults, the cause of a hydrocele may not be known. However, it is sometimes caused by an infection or the twisting of a testicle. Hydroceles often go away on their own. If a hydrocele causes pain, treating the underlying cause may be needed to ease the pain. This information is not intended to replace advice given to you by your health care  provider. Make sure you discuss any questions you have with your health care provider. Document Revised: 09/17/2020 Document Reviewed: 09/17/2020 Elsevier Patient Education  [place], Adult  A hydrocelectomy is a surgical procedure to remove a collection of fluid (hydrocele) from the scrotum. The scrotum is the pouch that holds the testicles. You may need to have this procedure if a hydrocele is causing painful swelling in your scrotum. Tell a health care provider about: Any allergies you have. All medicines you are taking, including vitamins, herbs, eye drops, creams, and over-the-counter medicines. Any problems you or family members have had with anesthetic medicines. Any bleeding problems you have. Any surgeries you have had. Any medical conditions you have. What are the risks? Generally, this is a safe procedure. However, problems may occur, including: Bleeding into the scrotum (scrotal hematoma). Damage to nearby structures or organs, including to the testicle or the tube that carries sperm out of the testicle (vas deferens). Infection. Allergic reactions to medicines. What happens before the procedure? When to stop eating and drinking Follow instructions from your health care provider about what you may eat and drink before your procedure. These may include: 8 hours before your procedure Stop eating most foods. Do not eat meat, fried foods, or fatty foods. Eat only light foods, such as toast or crackers. All liquids are okay except energy drinks and alcohol. 6 hours before your procedure Stop eating. Drink only clear liquids, such as water, clear fruit juice, black coffee, plain tea, and sports drinks. Do not drink energy drinks or alcohol. 2 hours before your procedure Stop drinking all liquids. You may be allowed to take medicines with small sips of water. If you do not follow your health care provider's instructions, your procedure may be delayed  or canceled. Medicines Ask your health care provider about: Changing or stopping your regular medicines. This is especially important if you are taking diabetes medicines or blood thinners. Taking medicines such as aspirin and ibuprofen. These medicines can thin your blood. Do not take these medicines unless your health care provider tells you to take them. Taking over-the-counter medicines, vitamins, herbs, and supplements. Surgery safety Ask your health care provider: How your surgery site will be marked. What steps will be taken to help prevent infection. These steps may include: Removing hair at the surgery site. Washing skin with a germ-killing soap. Taking antibiotic medicine. General instructions Do not use any products that contain nicotine or tobacco for at least 4 weeks before the procedure. These products include cigarettes, chewing tobacco, and vaping devices, such as e-cigarettes. If you need help quitting, ask your health care provider. If you will be going home right after the procedure, plan to have a responsible adult: Take you home from the hospital or clinic. You will not be allowed to drive. Care for you for the time you are told. What happens during the procedure? An IV will be inserted into one of your veins. You will be given one or both of the following: A medicine to make you relax (sedative). A medicine to make you fall asleep (general anesthetic).  A small incision will be made through the skin of your scrotum. Your testicle and the hydrocele will be located, and the hydrocele sac will be opened with an incision. The fluid will be drained from the hydrocele. Part of the hydrocele sac may be removed. The hydrocele will be closed with stitches that dissolve (absorbable sutures). This prevents fluid from building up again. If your hydrocele is large, you may have a thin, rubber drain placed to allow fluid to drain after the procedure. The incision in your scrotum  will be closed with absorbable sutures, skin glue, or adhesive strips. A bandage (dressing) will be placed over the incision. The dressing may be held in place with an athletic support strap (scrotal support). The procedure may vary among health care providers and hospitals. What happens after the procedure?  Your blood pressure, heart rate, breathing rate, and blood oxygen level will be monitored until you leave the hospital or clinic. You will be given pain medicine as needed. Your IV will be removed, and your insertion site will be checked for bleeding. You may need to wear a scrotal support. This holds the dressing in place and supports your scrotum. If you were given a sedative during the procedure, it can affect you for several hours. Do not drive or operate machinery until your health care provider says that it is safe. Summary A hydrocelectomy is a surgical procedure to remove a collection of fluid from the scrotum. You may need to have this procedure if a hydrocele is causing painful swelling in your scrotum. During the procedure, the hydrocele will be drained and then closed with stitches that dissolve (absorbable sutures). This prevents fluid from building up again. If your hydrocele is large, you may have a thin, rubber drain placed to allow fluid to drain after the procedure. You may need to wear a scrotal support after your procedure. This holds the dressing in place and supports your scrotum. This information is not intended to replace advice given to you by your health care provider. Make sure you discuss any questions you have with your health care provider. Document Revised: 09/17/2020 Document Reviewed: 09/17/2020 Elsevier Patient Education  [place], Adult, Care After The following information offers guidance on how to care for yourself after your procedure. Your health care provider may also give you more specific instructions. If you have problems  or questions, contact your health care provider. What can I expect after the procedure? After your procedure, it is common to have: Mild discomfort and swelling in the pouch that holds your testicles (scrotum). Bruising of the scrotum. Follow these instructions at home: Medicines Take over-the-counter and prescription medicines only as told by your health care provider. Ask your health care provider if the medicine prescribed to you: Requires you to avoid driving or using machinery. Can cause constipation. You may need to take these actions to prevent or treat constipation: Drink enough fluid to keep your urine pale yellow. Take over-the-counter or prescription medicines. Eat foods that are high in fiber, such as beans, whole grains, and fresh fruits and vegetables. Limit foods that are high in fat and processed sugars, such as fried or sweet foods. Bathing Do not take baths, swim, or use a hot tub until your health care provider approves. Ask your health care provider if you may take showers. You may only be allowed to take sponge baths. If you were told to wear an athletic support strap (scrotal support), keep it dry. Take  it off when you shower or bathe. Incision care  Follow instructions from your health care provider about how to take care of your incision. Make sure you: Wash your hands with soap and water for at least 20 seconds before and after you change your bandage (dressing). If soap and water are not available, use hand sanitizer. Change your dressing as told by your health care provider. Leave stitches (sutures), skin glue, or adhesive strips in place. These skin closures may need to stay in place for 2 weeks or longer. If adhesive strip edges start to loosen and curl up, you may trim the loose edges. Do not remove adhesive strips completely unless your health care provider tells you to do that. Check your incision and scrotum every day for signs of infection. Check for: More  redness, swelling, or pain. Fluid or blood. Warmth. Pus or a bad smell. Managing pain and swelling If directed, put ice on the affected area. To do this: Put ice in a plastic bag. Place a towel between your skin and the bag. Leave the ice on for 20 minutes, 2-3 times per day. Remove the ice if your skin turns bright red. This is very important. If you cannot feel pain, heat, or cold, you have a greater risk of damage to the area.  Activity Do not lift anything that is heavier than 10 lb (4.5 kg) until your health care provider says that it is safe. If you were given a sedative during the procedure, it can affect you for several hours. Do not drive or operate machinery until your health care provider says that it is safe. Ask your health care provider when it is safe to drive. Return to your normal activities as told by your health care provider. Ask your health care provider what activities are safe for you. General instructions Do not use any products that contain nicotine or tobacco. These products include cigarettes, chewing tobacco, and vaping devices, such as e-cigarettes. These can delay healing after surgery. If you need help quitting, ask your health care provider. If you were given a scrotal support, wear it as told by your health care provider. Keep all follow-up visits. This is important. If you had a drain put in during the procedure, you will need to have it removed at a follow-up visit. Contact a health care provider if: Your pain is not controlled with medicine. You have more redness, swelling, or pain around your scrotum. You have fluid or blood coming from your incision. Your incision feels warm to the touch. You have pus or a bad smell coming from your incision. You have a fever. Get help right away if: You develop shaking, chills, and a fever that is higher than 101.27F (38.8C). You have redness or swelling that starts at your scrotum and spreads outward to your whole  groin. You develop swelling in your legs. You have difficulty breathing. These symptoms may be an emergency. Get help right away. Call 911. Do not wait to see if the symptoms will go away. Do not drive yourself to the hospital. Summary After a hydrocelectomy, it is common to have mild discomfort, swelling, and bruising. Do not take baths, swim, or use a hot tub until your health care provider approves. Ask your health care provider if you may take showers. If directed, put ice on the affected area to help with pain and swelling. Do not lift anything that is heavier than 10 lb (4.5 kg) until your health care provider says  that it is safe. Return to your normal activities as told by your health care provider. If you were given a scrotal support, keep it dry. Wear the scrotal support as told by your health care provider. This information is not intended to replace advice given to you by your health care provider. Make sure you discuss any questions you have with your health care provider. Document Revised: 09/17/2020 Document Reviewed: 09/17/2020 Elsevier Patient Education  [place].

## 2023-04-07 ENCOUNTER — Other Ambulatory Visit: Payer: Self-pay | Admitting: Internal Medicine

## 2023-04-07 DIAGNOSIS — I2510 Atherosclerotic heart disease of native coronary artery without angina pectoris: Secondary | ICD-10-CM

## 2023-04-14 ENCOUNTER — Ambulatory Visit
Admission: RE | Admit: 2023-04-14 | Discharge: 2023-04-14 | Disposition: A | Discharge status: Home / Self Care | Payer: Self-pay | Source: Ambulatory Visit | Attending: Internal Medicine | Admitting: Internal Medicine

## 2023-04-14 DIAGNOSIS — I2510 Atherosclerotic heart disease of native coronary artery without angina pectoris: Secondary | ICD-10-CM | POA: Insufficient documentation
